# Patient Record
Sex: Female | Born: 1980 | Race: Black or African American | Hispanic: No | Marital: Married | Smoking: Never smoker
Health system: Southern US, Community
[De-identification: ages and names within clinical notes are randomized; demographics above are authoritative.]

## PROBLEM LIST (undated history)

## (undated) DIAGNOSIS — G510 Bell's palsy: Secondary | ICD-10-CM

## (undated) DIAGNOSIS — E282 Polycystic ovarian syndrome: Secondary | ICD-10-CM

## (undated) DIAGNOSIS — E119 Type 2 diabetes mellitus without complications: Secondary | ICD-10-CM

## (undated) HISTORY — PX: WISDOM TOOTH EXTRACTION: SHX21

## (undated) HISTORY — DX: Type 2 diabetes mellitus without complications: E11.9

---

## 2012-04-01 DIAGNOSIS — G510 Bell's palsy: Secondary | ICD-10-CM | POA: Insufficient documentation

## 2015-03-19 DIAGNOSIS — E119 Type 2 diabetes mellitus without complications: Secondary | ICD-10-CM | POA: Insufficient documentation

## 2015-06-06 ENCOUNTER — Encounter: Payer: Self-pay | Admitting: General Surgery

## 2015-06-06 ENCOUNTER — Ambulatory Visit (INDEPENDENT_AMBULATORY_CARE_PROVIDER_SITE_OTHER): Payer: BLUE CROSS/BLUE SHIELD | Admitting: General Surgery

## 2015-06-06 VITALS — BP 134/82 | HR 70 | Resp 12 | Ht 67.0 in | Wt 247.0 lb

## 2015-06-06 DIAGNOSIS — L02411 Cutaneous abscess of right axilla: Secondary | ICD-10-CM | POA: Diagnosis not present
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
## 2015-06-06 NOTE — Patient Instructions (Signed)
The patient is aware to call back for any questions or concerns.  

## 2015-06-07 ENCOUNTER — Encounter: Payer: Self-pay | Admitting: General Surgery

## 2015-06-10 ENCOUNTER — Telehealth: Payer: Self-pay | Admitting: *Deleted

## 2015-06-10 NOTE — Telephone Encounter (Signed)
Patient calling with questions regarding dressing changes. She is unsure if she needs to be changing outer or inner dressings.   This patient had an incision and drainage of a right axillary abscess last Thursday, 06-06-15.  Patient is scheduled for follow up with Dr. Evette Cristal on 07-11-15.  Please call patient on cell number, [phone].

## 2015-06-10 NOTE — Telephone Encounter (Signed)
Spoke with patient and advised to change all layers of her dressing. She may shower. Pat area dry and use gauze if needed and a bandage to cover until drainage stops. She is to call with any further questions. Follow up as scheduled.

## 2015-06-13 LAB — ANAEROBIC AND AEROBIC CULTURE

## 2015-07-11 ENCOUNTER — Ambulatory Visit: Payer: BLUE CROSS/BLUE SHIELD | Admitting: General Surgery

## 2015-07-24 ENCOUNTER — Telehealth: Payer: Self-pay | Admitting: *Deleted

## 2015-07-24 NOTE — Telephone Encounter (Signed)
Just calling to check and she if she is better or having any issues since her last visit.

## 2015-09-11 ENCOUNTER — Encounter: Payer: Self-pay | Admitting: *Deleted

## 2016-04-02 DIAGNOSIS — E119 Type 2 diabetes mellitus without complications: Secondary | ICD-10-CM | POA: Insufficient documentation

## 2016-04-02 DIAGNOSIS — N920 Excessive and frequent menstruation with regular cycle: Secondary | ICD-10-CM | POA: Insufficient documentation

## 2016-04-02 DIAGNOSIS — E559 Vitamin D deficiency, unspecified: Secondary | ICD-10-CM | POA: Insufficient documentation

## 2016-04-03 DIAGNOSIS — N939 Abnormal uterine and vaginal bleeding, unspecified: Secondary | ICD-10-CM | POA: Insufficient documentation

## 2016-11-02 ENCOUNTER — Emergency Department
Admission: EM | Admit: 2016-11-02 | Discharge: 2016-11-02 | Disposition: A | Discharge status: Home / Self Care | Payer: BLUE CROSS/BLUE SHIELD | Attending: Emergency Medicine | Admitting: Emergency Medicine

## 2016-11-02 ENCOUNTER — Encounter: Payer: Self-pay | Admitting: Emergency Medicine

## 2016-11-02 DIAGNOSIS — Z7984 Long term (current) use of oral hypoglycemic drugs: Secondary | ICD-10-CM | POA: Insufficient documentation

## 2016-11-02 DIAGNOSIS — E119 Type 2 diabetes mellitus without complications: Secondary | ICD-10-CM | POA: Insufficient documentation

## 2016-11-02 DIAGNOSIS — H9201 Otalgia, right ear: Secondary | ICD-10-CM | POA: Diagnosis not present

## 2016-11-02 MED ORDER — IBUPROFEN 600 MG PO TABS
600.0000 mg | ORAL_TABLET | Freq: Once | ORAL | Status: AC
  Administered 2016-11-02: 600 mg via ORAL
  Filled 2016-11-02: qty 1

## 2016-11-02 MED ORDER — TRAMADOL HCL 50 MG PO TABS: 50.0000 mg | ORAL_TABLET | Freq: Four times a day (QID) | ORAL | 0 refills | Status: AC | PRN

## 2016-11-02 MED ORDER — AMOXICILLIN 500 MG PO CAPS: 500.0000 mg | ORAL_CAPSULE | Freq: Three times a day (TID) | ORAL | 0 refills | Status: DC

## 2016-11-02 MED ORDER — PSEUDOEPHEDRINE HCL ER 120 MG PO TB12
120.0000 mg | ORAL_TABLET | Freq: Two times a day (BID) | ORAL | Status: DC
  Administered 2016-11-02: 120 mg via ORAL
  Filled 2016-11-02: qty 1

## 2016-11-02 MED ORDER — IBUPROFEN 600 MG PO TABS: 600.0000 mg | ORAL_TABLET | Freq: Three times a day (TID) | ORAL | 0 refills | Status: DC | PRN

## 2016-11-02 MED ORDER — PSEUDOEPHEDRINE HCL ER 120 MG PO TB12: 120.0000 mg | ORAL_TABLET | Freq: Two times a day (BID) | ORAL | 0 refills | Status: AC | PRN

## 2016-11-02 MED ORDER — TRAMADOL HCL 50 MG PO TABS
50.0000 mg | ORAL_TABLET | Freq: Once | ORAL | Status: AC
  Administered 2016-11-02: 50 mg via ORAL
  Filled 2016-11-02: qty 1

## 2016-11-02 NOTE — ED Notes (Signed)
See triage note   States she developed pain to right side of face on Friday  Pain is mainly in right ear and glands are swollen had some fever yesterday  Afebrile on arrival

## 2016-11-02 NOTE — ED Provider Notes (Signed)
[facility] Emergency Department Provider Note   ____________________________________________   First MD Initiated Contact with Patient 11/02/16 0813     (approximate)  I have reviewed the triage vital signs and the nursing notes.   HISTORY  Chief Complaint Otalgia  Patient complaining  HPI Sara Henry is a 36 y.o. female patient complaining of right ear pain which started yesterday. Patient denies any URI signs and symptoms. Patient also states she knows the gland behind her right ear is swollen. Patient had mild fever yesterday. Patient afebrile in triage. Patient rates the pain as 8/10.Patient describes pain as "achy". No palliative measures taken for this complaint. Patient denies hearing loss.   Past Medical History:  Diagnosis Date  . Diabetes mellitus without complication (HCC)     There are no active problems to display for this patient.   Past Surgical History:  Procedure Laterality Date  . WISDOM TOOTH EXTRACTION      Prior to Admission medications   Medication Sig Start Date End Date Taking? Authorizing Provider  metFORMIN (GLUCOPHAGE) 500 MG tablet Take 500 mg by mouth 2 (two) times daily with a meal.   Yes Historical Provider, MD  amoxicillin (AMOXIL) 500 MG capsule Take 1 capsule (500 mg total) by mouth 3 (three) times daily. 11/02/16   Joni Reining, PA-C  ibuprofen (ADVIL,MOTRIN) 600 MG tablet Take 1 tablet (600 mg total) by mouth every 8 (eight) hours as needed. 11/02/16   Joni Reining, PA-C  pseudoephedrine (SUDAFED) 120 MG 12 hr tablet Take 1 tablet (120 mg total) by mouth 2 (two) times daily as needed for congestion. 11/02/16 11/02/17  Joni Reining, PA-C  sulfamethoxazole-trimethoprim (BACTRIM DS,SEPTRA DS) 800-160 MG per tablet  06/01/15   Historical Provider, MD  traMADol (ULTRAM) 50 MG tablet Take 1 tablet (50 mg total) by mouth every 6 (six) hours as needed. 11/02/16 11/02/17  Joni Reining, PA-C    Allergies Patient  has no known allergies.  Family History  Problem Relation Age of Onset  . Diabetes Father     Social History Social History  Substance Use Topics  . Smoking status: Never Smoker  . Smokeless tobacco: Never Used  . Alcohol use No    Review of Systems Constitutional: No fever/chills Eyes: No visual changes. ENT: No sore throat. Right ear pain Cardiovascular: Denies chest pain. Respiratory: Denies shortness of breath. Gastrointestinal: No abdominal pain.  No nausea, no vomiting.  No diarrhea.  No constipation. Genitourinary: Negative for dysuria. Musculoskeletal: Negative for back pain. Skin: Negative for rash. Neurological: Negative for headaches, focal weakness or numbness.    ____________________________________________   PHYSICAL EXAM:  VITAL SIGNS: ED Triage Vitals  Enc Vitals Group     BP 11/02/16 0802 116/77     Pulse Rate 11/02/16 0802 (!) 58     Resp 11/02/16 0802 18     Temp 11/02/16 0802 98.2 F (36.8 C)     Temp Source 11/02/16 0802 Oral     SpO2 11/02/16 0802 97 %     Weight 11/02/16 0803 247 lb (112 kg)     Height --      Head Circumference --      Peak Flow --      Pain Score 11/02/16 0803 5     Pain Loc --      Pain Edu? --      Excl. in GC? --     Constitutional: Alert and oriented. Well appearing and in no  acute distress. Eyes: Conjunctivae are normal. PERRL. EOMI. Head: Atraumatic. Nose: No congestion/rhinnorhea. Mouth/Throat: Mucous membranes are moist.  Oropharynx non-erythematous. EARS: Edematous and erythematous right TM. Neck: No stridor.  No cervical spine tenderness to palpation. Hematological/Lymphatic/Immunilogical: Right post auricular lymphadenopathy. Cardiovascular: Normal rate, regular rhythm. Grossly normal heart sounds.  Good peripheral circulation. Respiratory: Normal respiratory effort.  No retractions. Lungs CTAB. Gastrointestinal: Soft and nontender. No distention. No abdominal bruits. No CVA  tenderness. Musculoskeletal: No lower extremity tenderness nor edema.  No joint effusions. Neurologic:  Normal speech and language. No gross focal neurologic deficits are appreciated. No gait instability. Skin:  Skin is warm, dry and intact. No rash noted. Psychiatric: Mood and affect are normal. Speech and behavior are normal.  ____________________________________________   LABS (all labs ordered are listed, but only abnormal results are displayed)  Labs Reviewed - No data to display ____________________________________________  EKG   ____________________________________________  RADIOLOGY   ____________________________________________   PROCEDURES  Procedure(s) performed: None  Procedures  Critical Care performed: No  ____________________________________________   INITIAL IMPRESSION / ASSESSMENT AND PLAN / ED COURSE  Pertinent labs & imaging results that were available during my care of the patient were reviewed by me and considered in my medical decision making (see chart for details).  Right otitis media. Patient given discharge care instructions. Patient given a prescription for amoxicillin, tramadol, Sudafed, and ibuprofen. Patient advised follow-up family doctor no improvement 3 days.      ____________________________________________   FINAL CLINICAL IMPRESSION(S) / ED DIAGNOSES  Final diagnoses:  Otalgia of right ear      NEW MEDICATIONS STARTED DURING THIS VISIT:  New Prescriptions   AMOXICILLIN (AMOXIL) 500 MG CAPSULE    Take 1 capsule (500 mg total) by mouth 3 (three) times daily.   IBUPROFEN (ADVIL,MOTRIN) 600 MG TABLET    Take 1 tablet (600 mg total) by mouth every 8 (eight) hours as needed.   PSEUDOEPHEDRINE (SUDAFED) 120 MG 12 HR TABLET    Take 1 tablet (120 mg total) by mouth 2 (two) times daily as needed for congestion.   TRAMADOL (ULTRAM) 50 MG TABLET    Take 1 tablet (50 mg total) by mouth every 6 (six) hours as needed.     Note:   This document was prepared using Dragon voice recognition software and may include unintentional dictation errors.    Joni Reiningonald K Rosalba Totty, PA-C 11/02/16 [id number]    Rockne MenghiniAnne-Caroline Norman, MD 11/02/16 [phone]

## 2016-11-02 NOTE — ED Triage Notes (Signed)
Pt presents with right ear pain started yesterday.

## 2016-11-06 ENCOUNTER — Telehealth: Payer: Self-pay | Admitting: Emergency Medicine

## 2016-11-06 NOTE — Telephone Encounter (Signed)
Called in response to voicemail patient left me. Mailbox full, so I could not leave message.

## 2017-04-14 ENCOUNTER — Encounter: Payer: Self-pay | Admitting: Emergency Medicine

## 2017-04-14 ENCOUNTER — Emergency Department
Admission: EM | Admit: 2017-04-14 | Discharge: 2017-04-14 | Disposition: A | Discharge status: Home / Self Care | Payer: BLUE CROSS/BLUE SHIELD | Attending: Emergency Medicine | Admitting: Emergency Medicine

## 2017-04-14 ENCOUNTER — Emergency Department: Payer: BLUE CROSS/BLUE SHIELD

## 2017-04-14 DIAGNOSIS — R1030 Lower abdominal pain, unspecified: Secondary | ICD-10-CM | POA: Diagnosis not present

## 2017-04-14 DIAGNOSIS — S0083XA Contusion of other part of head, initial encounter: Secondary | ICD-10-CM

## 2017-04-14 DIAGNOSIS — Z7984 Long term (current) use of oral hypoglycemic drugs: Secondary | ICD-10-CM | POA: Insufficient documentation

## 2017-04-14 DIAGNOSIS — Y999 Unspecified external cause status: Secondary | ICD-10-CM | POA: Insufficient documentation

## 2017-04-14 DIAGNOSIS — T7491XA Unspecified adult maltreatment, confirmed, initial encounter: Secondary | ICD-10-CM

## 2017-04-14 DIAGNOSIS — H538 Other visual disturbances: Secondary | ICD-10-CM | POA: Diagnosis not present

## 2017-04-14 DIAGNOSIS — X58XXXA Exposure to other specified factors, initial encounter: Secondary | ICD-10-CM | POA: Diagnosis not present

## 2017-04-14 DIAGNOSIS — Y92009 Unspecified place in unspecified non-institutional (private) residence as the place of occurrence of the external cause: Secondary | ICD-10-CM | POA: Diagnosis not present

## 2017-04-14 DIAGNOSIS — S0003XA Contusion of scalp, initial encounter: Secondary | ICD-10-CM | POA: Insufficient documentation

## 2017-04-14 DIAGNOSIS — G501 Atypical facial pain: Secondary | ICD-10-CM | POA: Diagnosis present

## 2017-04-14 DIAGNOSIS — Y939 Activity, unspecified: Secondary | ICD-10-CM | POA: Insufficient documentation

## 2017-04-14 DIAGNOSIS — E119 Type 2 diabetes mellitus without complications: Secondary | ICD-10-CM | POA: Diagnosis not present

## 2017-04-14 DIAGNOSIS — H1132 Conjunctival hemorrhage, left eye: Secondary | ICD-10-CM | POA: Insufficient documentation

## 2017-04-14 LAB — BASIC METABOLIC PANEL
ANION GAP: 7 (ref 5–15)
BUN: 8 mg/dL (ref 6–20)
CALCIUM: 9.2 mg/dL (ref 8.9–10.3)
CO2: 24 mmol/L (ref 22–32)
Chloride: 107 mmol/L (ref 101–111)
Creatinine, Ser: 0.7 mg/dL (ref 0.44–1.00)
GFR calc Af Amer: >60 mL/min (ref >60)
GLUCOSE: 100 mg/dL — AB (ref 65–99)
POTASSIUM: 3.7 mmol/L (ref 3.5–5.1)
SODIUM: 138 mmol/L (ref 135–145)

## 2017-04-14 LAB — CBC WITH DIFFERENTIAL/PLATELET
Basophils Absolute: 0 10*3/uL (ref 0–0.1)
Basophils Relative: 0 %
Eosinophils Absolute: 0 10*3/uL (ref 0–0.7)
Eosinophils Relative: 0 %
HCT: 33.9 % — ABNORMAL LOW (ref 35.0–47.0)
Hemoglobin: 11 g/dL — ABNORMAL LOW (ref 12.0–16.0)
Lymphocytes Relative: 24 %
Lymphs Abs: 2.4 10*3/uL (ref 1.0–3.6)
MCH: 25.7 pg — ABNORMAL LOW (ref 26.0–34.0)
MCHC: 32.3 g/dL (ref 32.0–36.0)
MCV: 79.4 fL — ABNORMAL LOW (ref 80.0–100.0)
Monocytes Absolute: 0.7 10*3/uL (ref 0.2–0.9)
Monocytes Relative: 7 %
Neutro Abs: 6.9 10*3/uL — ABNORMAL HIGH (ref 1.4–6.5)
Neutrophils Relative %: 69 %
Platelets: 359 10*3/uL (ref 150–440)
RBC: 4.27 MIL/uL (ref 3.80–5.20)
RDW: 16.8 % — ABNORMAL HIGH (ref 11.5–14.5)
WBC: 10.1 10*3/uL (ref 3.6–11.0)

## 2017-04-14 LAB — HEPATIC FUNCTION PANEL
ALT: 28 U/L (ref 14–54)
AST: 22 U/L (ref 15–41)
Albumin: 4 g/dL (ref 3.5–5.0)
Alkaline Phosphatase: 60 U/L (ref 38–126)
Bilirubin, Direct: <0.1 mg/dL — ABNORMAL LOW (ref 0.1–0.5)
Total Bilirubin: 0.7 mg/dL (ref 0.3–1.2)
Total Protein: 7.5 g/dL (ref 6.5–8.1)

## 2017-04-14 LAB — LIPASE, BLOOD: Lipase: 33 U/L (ref 11–51)

## 2017-04-14 LAB — HCG, QUANTITATIVE, PREGNANCY

## 2017-04-14 MED ORDER — SODIUM CHLORIDE 0.9 % IV BOLUS (SEPSIS)
1000.0000 mL | Freq: Once | INTRAVENOUS | Status: AC
  Administered 2017-04-14: 1000 mL via INTRAVENOUS

## 2017-04-14 MED ORDER — IBUPROFEN 600 MG PO TABS
600.0000 mg | ORAL_TABLET | Freq: Once | ORAL | Status: AC
  Administered 2017-04-14: 600 mg via ORAL
  Filled 2017-04-14: qty 1

## 2017-04-14 MED ORDER — OXYCODONE-ACETAMINOPHEN 5-325 MG PO TABS
1.0000 | ORAL_TABLET | Freq: Once | ORAL | Status: DC
  Filled 2017-04-14: qty 1

## 2017-04-14 MED ORDER — IOPAMIDOL (ISOVUE-300) INJECTION 61%
100.0000 mL | Freq: Once | INTRAVENOUS | Status: AC | PRN
Start: 2017-04-14 — End: 2017-04-14
  Administered 2017-04-14: 100 mL via INTRAVENOUS
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
## 2017-04-14 NOTE — ED Notes (Signed)
Dr. Lamont Snowballifenbark in room to discuss plan of care.  Will continue to monitor.
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
## 2017-06-26 ENCOUNTER — Encounter: Payer: Self-pay | Admitting: Emergency Medicine

## 2017-06-26 ENCOUNTER — Emergency Department
Admission: EM | Admit: 2017-06-26 | Discharge: 2017-06-26 | Disposition: A | Discharge status: Home / Self Care | Payer: BLUE CROSS/BLUE SHIELD | Attending: Emergency Medicine | Admitting: Emergency Medicine

## 2017-06-26 DIAGNOSIS — G510 Bell's palsy: Secondary | ICD-10-CM | POA: Diagnosis not present

## 2017-06-26 DIAGNOSIS — Z7984 Long term (current) use of oral hypoglycemic drugs: Secondary | ICD-10-CM | POA: Insufficient documentation

## 2017-06-26 DIAGNOSIS — R29810 Facial weakness: Secondary | ICD-10-CM | POA: Diagnosis present

## 2017-06-26 DIAGNOSIS — E119 Type 2 diabetes mellitus without complications: Secondary | ICD-10-CM | POA: Insufficient documentation

## 2017-06-26 HISTORY — DX: Bell's palsy: G51.0

## 2017-06-26 LAB — GLUCOSE, CAPILLARY: GLUCOSE-CAPILLARY: 94 mg/dL (ref 65–99)

## 2017-06-26 MED ORDER — PREDNISONE 20 MG PO TABS: 60.0000 mg | ORAL_TABLET | Freq: Every day | ORAL | 0 refills | Status: DC

## 2017-06-26 MED ORDER — PREDNISONE 20 MG PO TABS
60.0000 mg | ORAL_TABLET | Freq: Once | ORAL | Status: AC
  Administered 2017-06-26: 60 mg via ORAL
  Filled 2017-06-26: qty 3

## 2017-06-26 MED ORDER — VALACYCLOVIR HCL 1 G PO TABS: 1000.0000 mg | ORAL_TABLET | Freq: Three times a day (TID) | ORAL | 0 refills | Status: AC

## 2017-06-26 MED ORDER — VALACYCLOVIR HCL 500 MG PO TABS
1000.0000 mg | ORAL_TABLET | Freq: Once | ORAL | Status: AC
  Administered 2017-06-26: 1000 mg via ORAL
  Filled 2017-06-26: qty 2

## 2017-06-26 NOTE — ED Triage Notes (Signed)
Pt to ED for right sided facial palsy x 8 days. Pt states that she started having discomfort in her face and facial swelling last Friday after being exposed to grass. Pt states that On Saturday she woke up with eye swelling, lips were swelling, on Sunday she developed tongue numbness. Pt was seen by her PCP who thought she was having an allergic reaction to grass and told her to take allergy medication. Pt states that her symptoms have not improved. Pt now has palsy on the right side of her face. Pt states that she has hx/o Bells Palsy but it has affected the left side of her face in the past.   Pt in NAD at this time.

## 2017-06-26 NOTE — ED Provider Notes (Addendum)
[facility] Emergency Department Provider Note  ____________________________________________   First MD Initiated Contact with Patient 06/26/17 [phone]     (approximate)  I have reviewed the triage vital signs and the nursing notes.   HISTORY  Chief Complaint Facial Droop   HPI Sara Henry is a 37 y.o. female with a history of diabetes and Bell's palsy who is presenting to the emergency department today with a right-sided facial droop. She says that her symptoms started a week and a half ago when freshly cut grass blue into her car window. She says at that time she had swelling of her left eye and swelling of the right side of her face was gout medicine by her primary care doctor. However, starting this past Wednesday and Thursday she began noticing right-sided facial drooping. She also noticed some dryness to her right eye. She has had 2 previous episodes of Bell's palsy, but both on the left. She says that Bell's palsy also runs in her family and that her grandmothers had issues with it in the past. She denies any stroke history. Denies any weakness or numbness besides the facial droop. Denies any pain.   Past Medical History:  Diagnosis Date  . Bell's palsy   . Diabetes mellitus without complication (HCC)     There are no active problems to display for this patient.   Past Surgical History:  Procedure Laterality Date  . WISDOM TOOTH EXTRACTION      Prior to Admission medications   Medication Sig Start Date End Date Taking? Authorizing Provider  ibuprofen (ADVIL,MOTRIN) 600 MG tablet Take 1 tablet (600 mg total) by mouth every 8 (eight) hours as needed. Patient not taking: Reported on 04/14/2017 11/02/16   Joni Reining, PA-C  metFORMIN (GLUCOPHAGE) 1000 MG tablet Take 1,000 mg by mouth 2 (two) times daily. 04/07/17   [provider]  pseudoephedrine (SUDAFED) 120 MG 12 hr tablet Take 1 tablet (120 mg total) by mouth 2 (two) times daily as  needed for congestion. Patient not taking: Reported on 04/14/2017 11/02/16 11/02/17  Joni Reining, PA-C  traMADol (ULTRAM) 50 MG tablet Take 1 tablet (50 mg total) by mouth every 6 (six) hours as needed. Patient not taking: Reported on 04/14/2017 11/02/16 11/02/17  Joni Reining, PA-C    Allergies Patient has no known allergies.  Family History  Problem Relation Age of Onset  . Diabetes Father     Social History Social History  Substance Use Topics  . Smoking status: Never Smoker  . Smokeless tobacco: Never Used  . Alcohol use No    Review of Systems  Constitutional: No fever/chills Eyes: No visual changes. ENT: No sore throat. Cardiovascular: Denies chest pain. Respiratory: Denies shortness of breath. Gastrointestinal: No abdominal pain.  No nausea, no vomiting.  No diarrhea.  No constipation. Genitourinary: Negative for dysuria. Musculoskeletal: Negative for back pain. Skin: Negative for rash. Neurological: Negative for headaches or numbness.   ____________________________________________   PHYSICAL EXAM:  VITAL SIGNS: ED Triage Vitals  Enc Vitals Group     BP 06/26/17 0810 140/81     Pulse Rate 06/26/17 0810 65     Resp 06/26/17 0810 16     Temp 06/26/17 0810 98.2 F (36.8 C)     Temp Source 06/26/17 0810 Oral     SpO2 06/26/17 0810 98 %     Weight 06/26/17 0811 255 lb (115.7 kg)     Height --      Head  Circumference --      Peak Flow --      Pain Score 06/26/17 0810 6     Pain Loc --      Pain Edu? --      Excl. in GC? --     Constitutional: Alert and oriented. Well appearing and in no acute distress. Eyes: Conjunctivae are normal. EOMI. Head: Atraumatic. Nose: No congestion/rhinnorhea.TMs are normal bilaterally. Mouth/Throat: Mucous membranes are moist. no cold sores/ulcerations  present Neck: No stridor.   Cardiovascular: Normal rate, regular rhythm. Grossly normal heart sounds.   Respiratory: Normal respiratory effort.  No retractions. Lungs  CTAB. Gastrointestinal: Soft and nontender. No distention. No CVA tenderness. Musculoskeletal: No lower extremity tenderness nor edema.  No joint effusions. Neurologic:  Normal speech and language.  Right-sided peripheral cranial nerve VII paralysis. Right eyelid lags behind the left when closing her eyes and there are no fullness present on 4 Edwin*) eyes tightly. There is equal sensation to the bilateral sides of the face. There is no tongue deviation.5 out of 5 strength throughout with no sensory deficits.  Skin:  Skin is warm, dry and intact. No rash noted. Psychiatric: Mood and affect are normal. Speech and behavior are normal.  ____________________________________________   LABS (all labs ordered are listed, but only abnormal results are displayed)  Labs Reviewed  B. BURGDOR[facility] ANTIBODIES  CBG MONITORING, ED   ____________________________________________  EKG   ____________________________________________  RADIOLOGY   ____________________________________________   PROCEDURES  Procedure(s) performed:   Procedures  Critical Care performed:   ____________________________________________   INITIAL IMPRESSION / ASSESSMENT AND PLAN / ED COURSE  Pertinent labs & imaging results that were available during my care of the patient were reviewed by me and considered in my medical decision making (see chart for details).  Patient also denies any known tick bites. We will send Lyme titers. She says that treating with steroids as well as antiviral medication had worked for her in the past. Clear peripheral nerve VII deficit to the right side of the patient's face. Unclear relation if any to her possible allergic reaction one half weeks ago. Patient says that the symptoms started late Wednesday and Thursday of this past week which puts her within a 72 are 10. She'll be given prednisone as well as valacyclovir. She'll be following up with her primary care doctor. We discussed  stroke symptoms as well as a possible CAT scan with the patient as well as myself believe that this is very consistent with Bell's palsy and that we may very go further workup or imaging at this time. She is understanding the plan and willing to comply.      ____________________________________________   FINAL CLINICAL IMPRESSION(S) / ED DIAGNOSES  Bell's palsy.    NEW MEDICATIONS STARTED DURING THIS VISIT:  New Prescriptions   No medications on file     Note:  This document was prepared using Dragon voice recognition software and may include unintentional dictation errors.     Albertus Chiarelli, Myra Rude, MD 06/26/17 [phone]  Patient states that she is a primary care doctor in [place], Myra Rude, MD 06/26/17 [phone]

## 2017-06-28 LAB — B. BURGDORFI ANTIBODIES

## 2017-07-09 ENCOUNTER — Emergency Department
Admission: EM | Admit: 2017-07-09 | Discharge: 2017-07-09 | Disposition: A | Discharge status: Home / Self Care | Payer: BLUE CROSS/BLUE SHIELD | Attending: Emergency Medicine | Admitting: Emergency Medicine

## 2017-07-09 DIAGNOSIS — Z79899 Other long term (current) drug therapy: Secondary | ICD-10-CM | POA: Insufficient documentation

## 2017-07-09 DIAGNOSIS — H9201 Otalgia, right ear: Secondary | ICD-10-CM | POA: Diagnosis not present

## 2017-07-09 DIAGNOSIS — H6981 Other specified disorders of Eustachian tube, right ear: Secondary | ICD-10-CM

## 2017-07-09 DIAGNOSIS — E119 Type 2 diabetes mellitus without complications: Secondary | ICD-10-CM | POA: Insufficient documentation

## 2017-07-09 DIAGNOSIS — Z7984 Long term (current) use of oral hypoglycemic drugs: Secondary | ICD-10-CM | POA: Insufficient documentation

## 2017-07-09 MED ORDER — CETIRIZINE HCL 10 MG PO TABS: 10.0000 mg | ORAL_TABLET | Freq: Every day | ORAL | 0 refills | Status: DC | PRN

## 2017-07-09 MED ORDER — LIDOCAINE HCL (PF) 1 % IJ SOLN
2.0000 mL | Freq: Once | INTRAMUSCULAR | Status: AC
  Administered 2017-07-09: 2 mL
  Filled 2017-07-09: qty 5

## 2017-07-09 MED ORDER — LORATADINE 10 MG PO TABS
10.0000 mg | ORAL_TABLET | Freq: Once | ORAL | Status: AC
  Administered 2017-07-09: 10 mg via ORAL
  Filled 2017-07-09: qty 1

## 2017-07-09 NOTE — ED Provider Notes (Signed)
[facility] Emergency Department Provider Note   ____________________________________________   First MD Initiated Contact with Patient 07/09/17 0129     (approximate)  I have reviewed the triage vital signs and the nursing notes.   HISTORY  Chief Complaint Otalgia    HPI Sara Henry is a 36 y.o. female who presents to the ED from home with a chief complaint of right ear pain. Patient reports she started with the scratchy throat this morning and is now having right ear pain and some itching to her left ear. Recently finished a ten-day course of Valtrex and prednisone for Bell's palsy and rates improvement in her right facial paralysis symptoms. States her right ear feels full and like there is fluid behind it. Denies associated fever, chills, cough, congestion, chest pain, shortness of breath, abdominal pain, nausea, vomiting. Denies recent travel or trauma. Denies swimming or air travel.   Past Medical History:  Diagnosis Date  . Bell's palsy   . Diabetes mellitus without complication (HCC)     There are no active problems to display for this patient.   Past Surgical History:  Procedure Laterality Date  . WISDOM TOOTH EXTRACTION      Prior to Admission medications   Medication Sig Start Date End Date Taking? Authorizing Provider  cetirizine (ZYRTEC) 10 MG tablet Take 1 tablet (10 mg total) by mouth daily as needed for allergies. 07/09/17   Irean Hong, MD  ibuprofen (ADVIL,MOTRIN) 600 MG tablet Take 1 tablet (600 mg total) by mouth every 8 (eight) hours as needed. Patient not taking: Reported on 04/14/2017 11/02/16   Joni Reining, PA-C  metFORMIN (GLUCOPHAGE) 1000 MG tablet Take 1,000 mg by mouth 2 (two) times daily. 04/07/17   [provider]  predniSONE (DELTASONE) 20 MG tablet Take 3 tablets (60 mg total) by mouth daily. 06/26/17 06/26/18  Myrna Blazer, MD  pseudoephedrine (SUDAFED) 120 MG 12 hr tablet Take 1 tablet (120  mg total) by mouth 2 (two) times daily as needed for congestion. Patient not taking: Reported on 04/14/2017 11/02/16 11/02/17  Joni Reining, PA-C  traMADol (ULTRAM) 50 MG tablet Take 1 tablet (50 mg total) by mouth every 6 (six) hours as needed. Patient not taking: Reported on 04/14/2017 11/02/16 11/02/17  Joni Reining, PA-C    Allergies Patient has no known allergies.  Family History  Problem Relation Age of Onset  . Diabetes Father     Social History Social History  Substance Use Topics  . Smoking status: Never Smoker  . Smokeless tobacco: Never Used  . Alcohol use No    Review of Systems  Constitutional: No fever/chills. Eyes: No visual changes. ENT: positive for right ear pain/fullness, left your itching and scratchy throat. Cardiovascular: Denies chest pain. Respiratory: Denies shortness of breath. Gastrointestinal: No abdominal pain.  No nausea, no vomiting.  No diarrhea.  No constipation. Genitourinary: Negative for dysuria. Musculoskeletal: Negative for back pain. Skin: Negative for rash. Neurological: Negative for headaches, focal weakness or numbness.   ____________________________________________   PHYSICAL EXAM:  VITAL SIGNS: ED Triage Vitals  Enc Vitals Group     BP 07/09/17 0026 127/72     Pulse Rate 07/09/17 0026 95     Resp 07/09/17 0026 18     Temp 07/09/17 0026 98.7 F (37.1 C)     Temp Source 07/09/17 0026 Oral     SpO2 07/09/17 0026 96 %     Weight 07/09/17 0026 255 lb (115.7 kg)  Height 07/09/17 0026  (1.702 m)     Head Circumference --      Peak Flow --      Pain Score 07/09/17 0025 8     Pain Loc --      Pain Edu? --      Excl. in GC? --     Constitutional: Alert and oriented. Well appearing and in no acute distress. Eyes: Conjunctivae are normal. PERRL. EOMI. Head: Atraumatic. Ears: Left ear within normal limits other than some cerumen. Right ear with mild fluid behind TM. TM without erythema or perforation. Nose: No  congestion/rhinnorhea. Mouth/Throat: Mucous membranes are moist.  Oropharynx non-erythematous. There is no hoarse muffled voice. There is no drooling. Neck: No stridor.   Cardiovascular: Normal rate, regular rhythm. Grossly normal heart sounds.  Good peripheral circulation. Respiratory: Normal respiratory effort.  No retractions. Lungs CTAB. Gastrointestinal: Soft and nontender. No distention. No abdominal bruits. No CVA tenderness. Musculoskeletal: No lower extremity tenderness nor edema.  No joint effusions. Neurologic:  Normal speech and language. Very mild right facial paralysis due to peripheral CN VII. No gross focal neurologic deficits are appreciated. No gait instability. Skin:  Skin is warm, dry and intact. No rash noted. No petechiae. Psychiatric: Mood and affect are normal. Speech and behavior are normal.  ____________________________________________   LABS (all labs ordered are listed, but only abnormal results are displayed)  Labs Reviewed - No data to display ____________________________________________  EKG  none ____________________________________________  RADIOLOGY  No results found.  ____________________________________________   PROCEDURES  Procedure(s) performed: None  Procedures  Critical Care performed: No  ____________________________________________   INITIAL IMPRESSION / ASSESSMENT AND PLAN / ED COURSE  Pertinent labs & imaging results that were available during my care of the patient were reviewed by me and considered in my medical decision making (see chart for details).  36 year old diabetic who presents with right eustachian tube dysfunction. She just finished a course of prednisone and Valtrex for Bell's palsy with improvement of her facial paralysis. Given that she is a diabetic who recently finished prednisone, we discussed my reluctance to place her on another course of steroid for her eustachian tube dysfunction which is mild.  Lidocaine drops applied for discomfort. Recommend taking a daily antihistamine as needed for symptomatic relief, and OTC eardrops for pain. Strict return precautions given. Patient verbalizes understanding and agrees with plan of care.      ____________________________________________   FINAL CLINICAL IMPRESSION(S) / ED DIAGNOSES  Final diagnoses:  Otalgia of right ear  Eustachian tube dysfunction, right      NEW MEDICATIONS STARTED DURING THIS VISIT:  New Prescriptions   CETIRIZINE (ZYRTEC) 10 MG TABLET    Take 1 tablet (10 mg total) by mouth daily as needed for allergies.     Note:  This document was prepared using Dragon voice recognition software and may include unintentional dictation errors.    Irean Hong, MD 07/09/17 [phone]

## 2017-07-09 NOTE — ED Notes (Signed)

## 2017-07-09 NOTE — Discharge Instructions (Signed)
1. Take Zyrtec daily as needed. This is also available over-the-counter. 2. Return to the ER for worsening symptoms, persistent vomiting, fever, difficulty breathing or other concerns.

## 2017-07-09 NOTE — ED Triage Notes (Signed)
Pt states that she started with a sore throat this am and now is having rt ear pain with some itching to the left ear, pt states that throughout the day the ear started to feel like it was closing up, pt reports being awakened from sleep with the pain tonight, denies taking meds pta

## 2017-09-24 ENCOUNTER — Other Ambulatory Visit: Payer: Self-pay

## 2017-09-24 ENCOUNTER — Encounter: Payer: Self-pay | Admitting: Emergency Medicine

## 2017-09-24 ENCOUNTER — Emergency Department: Payer: BLUE CROSS/BLUE SHIELD

## 2017-09-24 ENCOUNTER — Emergency Department
Admission: EM | Admit: 2017-09-24 | Discharge: 2017-09-24 | Disposition: A | Discharge status: Home / Self Care | Payer: BLUE CROSS/BLUE SHIELD | Attending: Emergency Medicine | Admitting: Emergency Medicine

## 2017-09-24 DIAGNOSIS — Y939 Activity, unspecified: Secondary | ICD-10-CM | POA: Diagnosis not present

## 2017-09-24 DIAGNOSIS — Y33XXXA Other specified events, undetermined intent, initial encounter: Secondary | ICD-10-CM | POA: Insufficient documentation

## 2017-09-24 DIAGNOSIS — S93601A Unspecified sprain of right foot, initial encounter: Secondary | ICD-10-CM | POA: Insufficient documentation

## 2017-09-24 DIAGNOSIS — E119 Type 2 diabetes mellitus without complications: Secondary | ICD-10-CM | POA: Diagnosis not present

## 2017-09-24 DIAGNOSIS — Z7984 Long term (current) use of oral hypoglycemic drugs: Secondary | ICD-10-CM | POA: Insufficient documentation

## 2017-09-24 DIAGNOSIS — Y998 Other external cause status: Secondary | ICD-10-CM | POA: Diagnosis not present

## 2017-09-24 DIAGNOSIS — Y929 Unspecified place or not applicable: Secondary | ICD-10-CM | POA: Diagnosis not present

## 2017-09-24 DIAGNOSIS — M79674 Pain in right toe(s): Secondary | ICD-10-CM

## 2017-09-24 DIAGNOSIS — S99921A Unspecified injury of right foot, initial encounter: Secondary | ICD-10-CM | POA: Diagnosis present

## 2017-09-24 MED ORDER — FLUCONAZOLE 150 MG PO TABS: 150.0000 mg | ORAL_TABLET | Freq: Every day | ORAL | 1 refills | Status: DC

## 2017-09-24 MED ORDER — NABUMETONE 750 MG PO TABS: 750.0000 mg | ORAL_TABLET | Freq: Two times a day (BID) | ORAL | 0 refills | Status: DC

## 2017-09-24 MED ORDER — SULFAMETHOXAZOLE-TRIMETHOPRIM 800-160 MG PO TABS: 1.0000 | ORAL_TABLET | Freq: Two times a day (BID) | ORAL | 0 refills | Status: DC

## 2017-09-24 NOTE — ED Triage Notes (Signed)
Pt reports right middle toe and foot pain for several days. Pt reports worsened pain today with swelling. Pt ambulatory but limping on right foot.

## 2017-09-24 NOTE — ED Notes (Signed)
This RN attempted to call patient to alert her that charge was left in patient room. No answer to call; patient's voicemail box is full. Charger labeled at left with first nurse. Charge RN informed.

## 2017-09-24 NOTE — ED Notes (Signed)
Patient has questions about work restrictions. RN informed patient she would discuss with provider and have provider come speak with her.

## 2017-09-24 NOTE — ED Notes (Signed)
ED Provider at bedside. 

## 2017-09-24 NOTE — ED Notes (Signed)
This RN reviewed discharge instructions, follow-up care, prescriptions, cryotherapy, and need for elevation with patient. Patient verbalized understanding of all reviewed information.  Patient stable, with no distress noted at this time. 

## 2017-09-24 NOTE — ED Notes (Signed)
Portable XR at bedside

## 2017-09-24 NOTE — ED Triage Notes (Signed)
Arrives c/o right foot swelling.  Ambulatory.  Gait steady NAD

## 2017-09-24 NOTE — ED Notes (Addendum)
Pt states pain in right foot began Wednesday and she noticed while moving her toes. Pain continued to increase until today Pt reports noticing swelling this morning at 3:34am when she woke up from the pain. Pt states the most pain is at the base of her third toe.

## 2017-09-24 NOTE — Discharge Instructions (Signed)
Your exam and x-ray are essentially normal. There is no evidence of fracture. You are being placed on antibiotics for any potential skin infection. Take the anti-inflammatory for pain and inflammation. Follow-up with podiatry for ongoing symptoms.

## 2017-09-25 NOTE — ED Provider Notes (Signed)
[facility] Emergency Department Provider Note ____________________________________________  Time seen: 2005  I have reviewed the triage vital signs and the nursing notes.  HISTORY  Chief Complaint  Foot Pain  HPI Sara Henry is a 36 y.o. female presents to the ED for evaluation of pain to the right middle toe, without known injury, trauma, or contusion. She reports pain for several days that is aggravated by walking. She denies any benefit with ibuprofen. She notes some swelling and redness to the top of the foot. She is diabetic, but denies any paresthesias or foot wounds.   Past Medical History:  Diagnosis Date  . Bell's palsy   . Diabetes mellitus without complication (HCC)     There are no active problems to display for this patient.   Past Surgical History:  Procedure Laterality Date  . WISDOM TOOTH EXTRACTION      Prior to Admission medications   Medication Sig Start Date End Date Taking? Authorizing Provider  cetirizine (ZYRTEC) 10 MG tablet Take 1 tablet (10 mg total) by mouth daily as needed for allergies. 07/09/17   Irean HongSung, Jade J, MD  fluconazole (DIFLUCAN) 150 MG tablet Take 1 tablet (150 mg total) by mouth daily. 09/24/17   Orvil FeilWoods, Jaclyn M, PA-C  ibuprofen (ADVIL,MOTRIN) 600 MG tablet Take 1 tablet (600 mg total) by mouth every 8 (eight) hours as needed. Patient not taking: Reported on 04/14/2017 11/02/16   Joni ReiningSmith, Ronald K, PA-C  metFORMIN (GLUCOPHAGE) 1000 MG tablet Take 1,000 mg by mouth 2 (two) times daily. 04/07/17   [provider]  nabumetone (RELAFEN) 750 MG tablet Take 1 tablet (750 mg total) by mouth 2 (two) times daily. 09/24/17   Corissa Oguinn, Charlesetta IvoryJenise V Bacon, PA-C  predniSONE (DELTASONE) 20 MG tablet Take 3 tablets (60 mg total) by mouth daily. 06/26/17 06/26/18  Myrna BlazerSchaevitz, David Matthew, MD  pseudoephedrine (SUDAFED) 120 MG 12 hr tablet Take 1 tablet (120 mg total) by mouth 2 (two) times daily as needed for congestion. Patient not  taking: Reported on 04/14/2017 11/02/16 11/02/17  Joni ReiningSmith, Ronald K, PA-C  sulfamethoxazole-trimethoprim (BACTRIM DS,SEPTRA DS) 800-160 MG tablet Take 1 tablet by mouth 2 (two) times daily. 09/24/17   Michala Deblanc, Charlesetta IvoryJenise V Bacon, PA-C  traMADol (ULTRAM) 50 MG tablet Take 1 tablet (50 mg total) by mouth every 6 (six) hours as needed. Patient not taking: Reported on 04/14/2017 11/02/16 11/02/17  Joni ReiningSmith, Ronald K, PA-C    Allergies Patient has no known allergies.  Family History  Problem Relation Age of Onset  . Diabetes Father     Social History Social History   Tobacco Use  . Smoking status: Never Smoker  . Smokeless tobacco: Never Used  Substance Use Topics  . Alcohol use: No    Alcohol/week: 0.0 oz  . Drug use: No    Review of Systems  Constitutional: Negative for fever. Cardiovascular: Negative for chest pain. Respiratory: Negative for shortness of breath. Musculoskeletal: Negative for back pain. Right toe pain as above. Skin: Negative for rash. Neurological: Negative for headaches, focal weakness or numbness. ____________________________________________  PHYSICAL EXAM:  VITAL SIGNS: ED Triage Vitals  Enc Vitals Group     BP 09/24/17 1847 130/67     Pulse Rate 09/24/17 1847 75     Resp 09/24/17 1847 18     Temp 09/24/17 1847 98.2 F (36.8 C)     Temp Source 09/24/17 1847 Oral     SpO2 09/24/17 1847 100 %     Weight 09/24/17 1846 250  lb (113.4 kg)     Height 09/24/17 1846 5\' 7"  (1.702 m)     Head Circumference --      Peak Flow --      Pain Score 09/24/17 1846 8     Pain Loc --      Pain Edu? --      Excl. in GC? --     Constitutional: Alert and oriented. Well appearing and in no distress. Head: Normocephalic and atraumatic. Cardiovascular: Normal rate, regular rhythm. Normal distal pulses. Respiratory: Normal respiratory effort. No wheezes/rales/rhonchi. Musculoskeletal: right foot without obvious deformity, dislocation, or edema. There is some mild STS and erythema  over the dorsal middle toe MTP. Normal toe dorsiflexion noted. No nail injury noted. No plantar surface wounds or calluses noted.  Nontender with normal range of motion in all extremities.  Neurologic:  Antalgic gait without ataxia. Normal speech and language. No gross focal neurologic deficits are appreciated. Skin:  Skin is warm, dry and intact. No rash noted. ___________________________________________   RADIOLOGY  Right Foot  IMPRESSION: No acute bony findings. ____________________________________________  PROCEDURES  Post-op Shoe ____________________________________________  INITIAL IMPRESSION / ASSESSMENT AND PLAN / ED COURSE  Patient with ED evaluation of right foot pain at the middle toe. No known injury or trauma. Her exam and x-ray are negative for acute fracture or dislocation. Her exam is somewhat suspicious of an early infectious process. She is placed on Relafen and Bactrim DS, as well as diflucan for yeast. She is placed in a post-op shoe and referred to podiatry for further evaluation. A work note for 2 days is provided, as requested.  ____________________________________________  FINAL CLINICAL IMPRESSION(S) / ED DIAGNOSES  Final diagnoses:  Sprain of right foot, initial encounter  Pain of toe of right foot     Lissa HoardMenshew, Kinsly Hild V Bacon, PA-C 09/25/17 0042    Phineas SemenGoodman, Graydon, MD 09/25/17 [phone]

## 2017-11-17 ENCOUNTER — Ambulatory Visit: Payer: Self-pay | Admitting: Family Medicine

## 2017-11-18 ENCOUNTER — Ambulatory Visit: Payer: Self-pay | Admitting: Family Medicine

## 2018-01-18 DIAGNOSIS — N921 Excessive and frequent menstruation with irregular cycle: Secondary | ICD-10-CM | POA: Insufficient documentation

## 2018-01-18 DIAGNOSIS — Z6839 Body mass index (BMI) 39.0-39.9, adult: Secondary | ICD-10-CM | POA: Insufficient documentation

## 2018-01-18 DIAGNOSIS — Z8742 Personal history of other diseases of the female genital tract: Secondary | ICD-10-CM | POA: Insufficient documentation

## 2018-01-18 DIAGNOSIS — Q519 Congenital malformation of uterus and cervix, unspecified: Secondary | ICD-10-CM | POA: Insufficient documentation

## 2018-02-08 DIAGNOSIS — N970 Female infertility associated with anovulation: Secondary | ICD-10-CM | POA: Insufficient documentation

## 2018-02-19 ENCOUNTER — Emergency Department: Payer: BLUE CROSS/BLUE SHIELD

## 2018-02-19 ENCOUNTER — Other Ambulatory Visit: Payer: Self-pay

## 2018-02-19 ENCOUNTER — Encounter: Payer: Self-pay | Admitting: Emergency Medicine

## 2018-02-19 DIAGNOSIS — R079 Chest pain, unspecified: Secondary | ICD-10-CM | POA: Insufficient documentation

## 2018-02-19 DIAGNOSIS — E119 Type 2 diabetes mellitus without complications: Secondary | ICD-10-CM | POA: Insufficient documentation

## 2018-02-19 DIAGNOSIS — Z7984 Long term (current) use of oral hypoglycemic drugs: Secondary | ICD-10-CM | POA: Insufficient documentation

## 2018-02-19 LAB — BASIC METABOLIC PANEL
Anion gap: 7 (ref 5–15)
BUN: 9 mg/dL (ref 6–20)
CALCIUM: 8.8 mg/dL — AB (ref 8.9–10.3)
CHLORIDE: 105 mmol/L (ref 101–111)
CO2: 26 mmol/L (ref 22–32)
CREATININE: 0.63 mg/dL (ref 0.44–1.00)
GFR calc Af Amer: >60 mL/min (ref >60)
GFR calc non Af Amer: >60 mL/min (ref >60)
Glucose, Bld: 125 mg/dL — ABNORMAL HIGH (ref 65–99)
Potassium: 3.4 mmol/L — ABNORMAL LOW (ref 3.5–5.1)
Sodium: 138 mmol/L (ref 135–145)

## 2018-02-19 LAB — CBC
HEMATOCRIT: 30.7 % — AB (ref 35.0–47.0)
HEMOGLOBIN: 10 g/dL — AB (ref 12.0–16.0)
MCH: 26.3 pg (ref 26.0–34.0)
MCHC: 32.4 g/dL (ref 32.0–36.0)
MCV: 81 fL (ref 80.0–100.0)
Platelets: 369 10*3/uL (ref 150–440)
RBC: 3.79 MIL/uL — AB (ref 3.80–5.20)
RDW: 18 % — AB (ref 11.5–14.5)
WBC: 8.3 10*3/uL (ref 3.6–11.0)

## 2018-02-19 LAB — TROPONIN I: Troponin I: <0.03 ng/mL (ref <0.03)

## 2018-02-19 NOTE — ED Triage Notes (Signed)
Pt says she was driving home from work in [place] about 40 minutes ago and had sudden onset of nonradiating left sided chest pain; says pain is sharp; feeling short of breath; denies N/V or diaphoresis; pt reports no history of same;

## 2018-02-20 ENCOUNTER — Emergency Department
Admission: EM | Admit: 2018-02-20 | Discharge: 2018-02-20 | Disposition: A | Discharge status: Home / Self Care | Payer: BLUE CROSS/BLUE SHIELD | Attending: Emergency Medicine | Admitting: Emergency Medicine

## 2018-02-20 DIAGNOSIS — R079 Chest pain, unspecified: Secondary | ICD-10-CM

## 2018-02-20 LAB — TROPONIN I

## 2018-02-20 MED ORDER — GI COCKTAIL ~~LOC~~
30.0000 mL | Freq: Once | ORAL | Status: AC
  Administered 2018-02-20: 30 mL via ORAL

## 2018-02-20 MED ORDER — GI COCKTAIL ~~LOC~~
ORAL | Status: AC
  Filled 2018-02-20: qty 30

## 2018-02-20 MED ORDER — SUCRALFATE 1 G PO TABS
1.0000 g | ORAL_TABLET | Freq: Once | ORAL | Status: AC
  Administered 2018-02-20: 1 g via ORAL

## 2018-02-20 MED ORDER — FAMOTIDINE 40 MG PO TABS: 40.0000 mg | ORAL_TABLET | Freq: Every evening | ORAL | 0 refills | Status: DC

## 2018-02-20 MED ORDER — SUCRALFATE 1 G PO TABS: 1.0000 g | ORAL_TABLET | Freq: Two times a day (BID) | ORAL | 0 refills | Status: AC

## 2018-02-20 MED ORDER — SUCRALFATE 1 G PO TABS
ORAL_TABLET | ORAL | Status: AC
  Filled 2018-02-20: qty 1

## 2018-02-20 NOTE — Discharge Instructions (Addendum)
Your work up is negative currently. Please follow up with your primary care physician for further evaluation of your symptoms.

## 2018-02-20 NOTE — ED Provider Notes (Signed)
[facility] Emergency Department Provider Note   ____________________________________________   First MD Initiated Contact with Patient 02/20/18 0024     (approximate)  I have reviewed the triage vital signs and the nursing notes.   HISTORY  Chief Complaint Chest Pain    HPI Sara Henry is a 37 y.o. female who comes into the hospital today with some sharp left-sided chest pain.  The patient was driving home and the pain would not go away so she decided to come here.  The patient denies any radiation of the pain and states it is under her left breast.  The patient states that it was hard for her to breathe but she denies any sweats dizziness or lightheadedness nausea or vomiting.  The patient rates her pain currently a 3 out of 10 in intensity.  She is never had this before.  The patient denies any abdominal pain or pain with urination.  She also denies any back pain.  The patient is here for evaluation.  Past Medical History:  Diagnosis Date  . Bell's palsy   . Diabetes mellitus without complication (HCC)     There are no active problems to display for this patient.   Past Surgical History:  Procedure Laterality Date  . WISDOM TOOTH EXTRACTION      Prior to Admission medications   Medication Sig Start Date End Date Taking? Authorizing Provider  metFORMIN (GLUCOPHAGE) 1000 MG tablet Take 1,000 mg by mouth 2 (two) times daily. 04/07/17  Yes [provider]    Allergies Patient has no known allergies.  Family History  Problem Relation Age of Onset  . Diabetes Father     Social History Social History   Tobacco Use  . Smoking status: Never Smoker  . Smokeless tobacco: Never Used  Substance Use Topics  . Alcohol use: No    Alcohol/week: 0.0 oz  . Drug use: No    Review of Systems  Constitutional: No fever/chills Eyes: No visual changes. ENT: No sore throat. Cardiovascular:  chest pain. Respiratory:  shortness of  breath. Gastrointestinal: No abdominal pain.  No nausea, no vomiting.  No diarrhea.  No constipation. Genitourinary: Negative for dysuria. Musculoskeletal: Negative for back pain. Skin: Negative for rash. Neurological: Negative for headaches, focal weakness or numbness.   ____________________________________________   PHYSICAL EXAM:  VITAL SIGNS: ED Triage Vitals  Enc Vitals Group     BP 02/19/18 2152 127/72     Pulse Rate 02/19/18 2152 78     Resp 02/19/18 2152 17     Temp 02/19/18 2152 97.9 F (36.6 C)     Temp Source 02/19/18 2152 Oral     SpO2 02/19/18 2152 99 %     Weight 02/19/18 2153 250 lb (113.4 kg)     Height 02/19/18 2153  (1.702 m)     Head Circumference --      Peak Flow --      Pain Score 02/19/18 2153 5     Pain Loc --      Pain Edu? --      Excl. in GC? --     Constitutional: Alert and oriented. Well appearing and in mild distress. Eyes: Conjunctivae are normal. PERRL. EOMI. Head: Atraumatic. Nose: No congestion/rhinnorhea. Mouth/Throat: Mucous membranes are moist.  Oropharynx non-erythematous. Cardiovascular: Normal rate, regular rhythm. Grossly normal heart sounds.  Good peripheral circulation. Respiratory: Normal respiratory effort.  No retractions. Lungs CTAB. Gastrointestinal: Soft and nontender. No distention.  Positive bowel sounds  Musculoskeletal: No lower extremity tenderness nor edema.   Neurologic:  Normal speech and language.  Skin:  Skin is warm, dry and intact.  Psychiatric: Mood and affect are normal.   ____________________________________________   LABS (all labs ordered are listed, but only abnormal results are displayed)  Labs Reviewed  BASIC METABOLIC PANEL - Abnormal; Notable for the following components:      Result Value   Potassium 3.4 (*)    Glucose, Bld 125 (*)    Calcium 8.8 (*)    All other components within normal limits  CBC - Abnormal; Notable for the following components:   RBC 3.79 (*)    Hemoglobin 10.0  (*)    HCT 30.7 (*)    RDW 18.0 (*)    All other components within normal limits  TROPONIN I  TROPONIN I   ____________________________________________  EKG  ED ECG REPORT I, Rebecka Apley, the attending physician, personally viewed and interpreted this ECG.   Date: 02/19/2018  EKG Time: 2155  Rate: 72  Rhythm: normal sinus rhythm  Axis: normal  Intervals:none  ST&T Change: none  ____________________________________________  RADIOLOGY  ED MD interpretation:  CXR: No active cardiopulmonary disease  Official radiology report(s): Dg Chest 2 View  Result Date: 02/19/2018 CLINICAL DATA:  Sudden onset nonradiating left-sided chest pain. EXAM: CHEST - 2 VIEW COMPARISON:  None. FINDINGS: The heart size and mediastinal contours are within normal limits. Lung volumes are slightly low with crowding of interstitial lung markings. No alveolar consolidation or overt pulmonary edema. The visualized skeletal structures are unremarkable. IMPRESSION: No active cardiopulmonary disease. Electronically Signed   By: Tollie Eth M.D.   On: 02/19/2018 22:16    ____________________________________________   PROCEDURES  Procedure(s) performed: None  Procedures  Critical Care performed: No  ____________________________________________   INITIAL IMPRESSION / ASSESSMENT AND PLAN / ED COURSE  As part of my medical decision making, I reviewed the following data within the electronic MEDICAL RECORD NUMBER Notes from prior ED visits and [place] Controlled Substance Database   This is a 37 year old female who comes into the hospital today with some chest pain.  My differential diagnosis includes acute coronary syndrome, gastritis, musculoskeletal pain, pneumonia.  We did check some blood work on the patient to include a CBC troponin and a BMP.  The patient's blood work is unremarkable.  She does have a hemoglobin of 10.  The patient also had a chest x-ray which was unremarkable.  I did give her  the patient a GI cocktail and some Carafate.  We repeated her troponin which was unremarkable.  The patient was sleeping comfortably.  She will be discharged home and encouraged to follow-up with her primary care physician.      ____________________________________________   FINAL CLINICAL IMPRESSION(S) / ED DIAGNOSES  Final diagnoses:  Chest pain, unspecified type     ED Discharge Orders    None       Note:  This document was prepared using Dragon voice recognition software and may include unintentional dictation errors.    Rebecka Apley, MD 02/20/18 [phone]

## 2018-07-09 IMAGING — CR DG CHEST 2V
1 series · 2 of 2 positions shown · non-contrast
Comparison: None.

CLINICAL DATA: Sudden onset nonradiating left-sided chest pain.

EXAM:
CHEST - 2 VIEW

[Series 1: dg chest 2 view · 0.14mm/px · 2 of 2 slices shown]
[im 1/2]
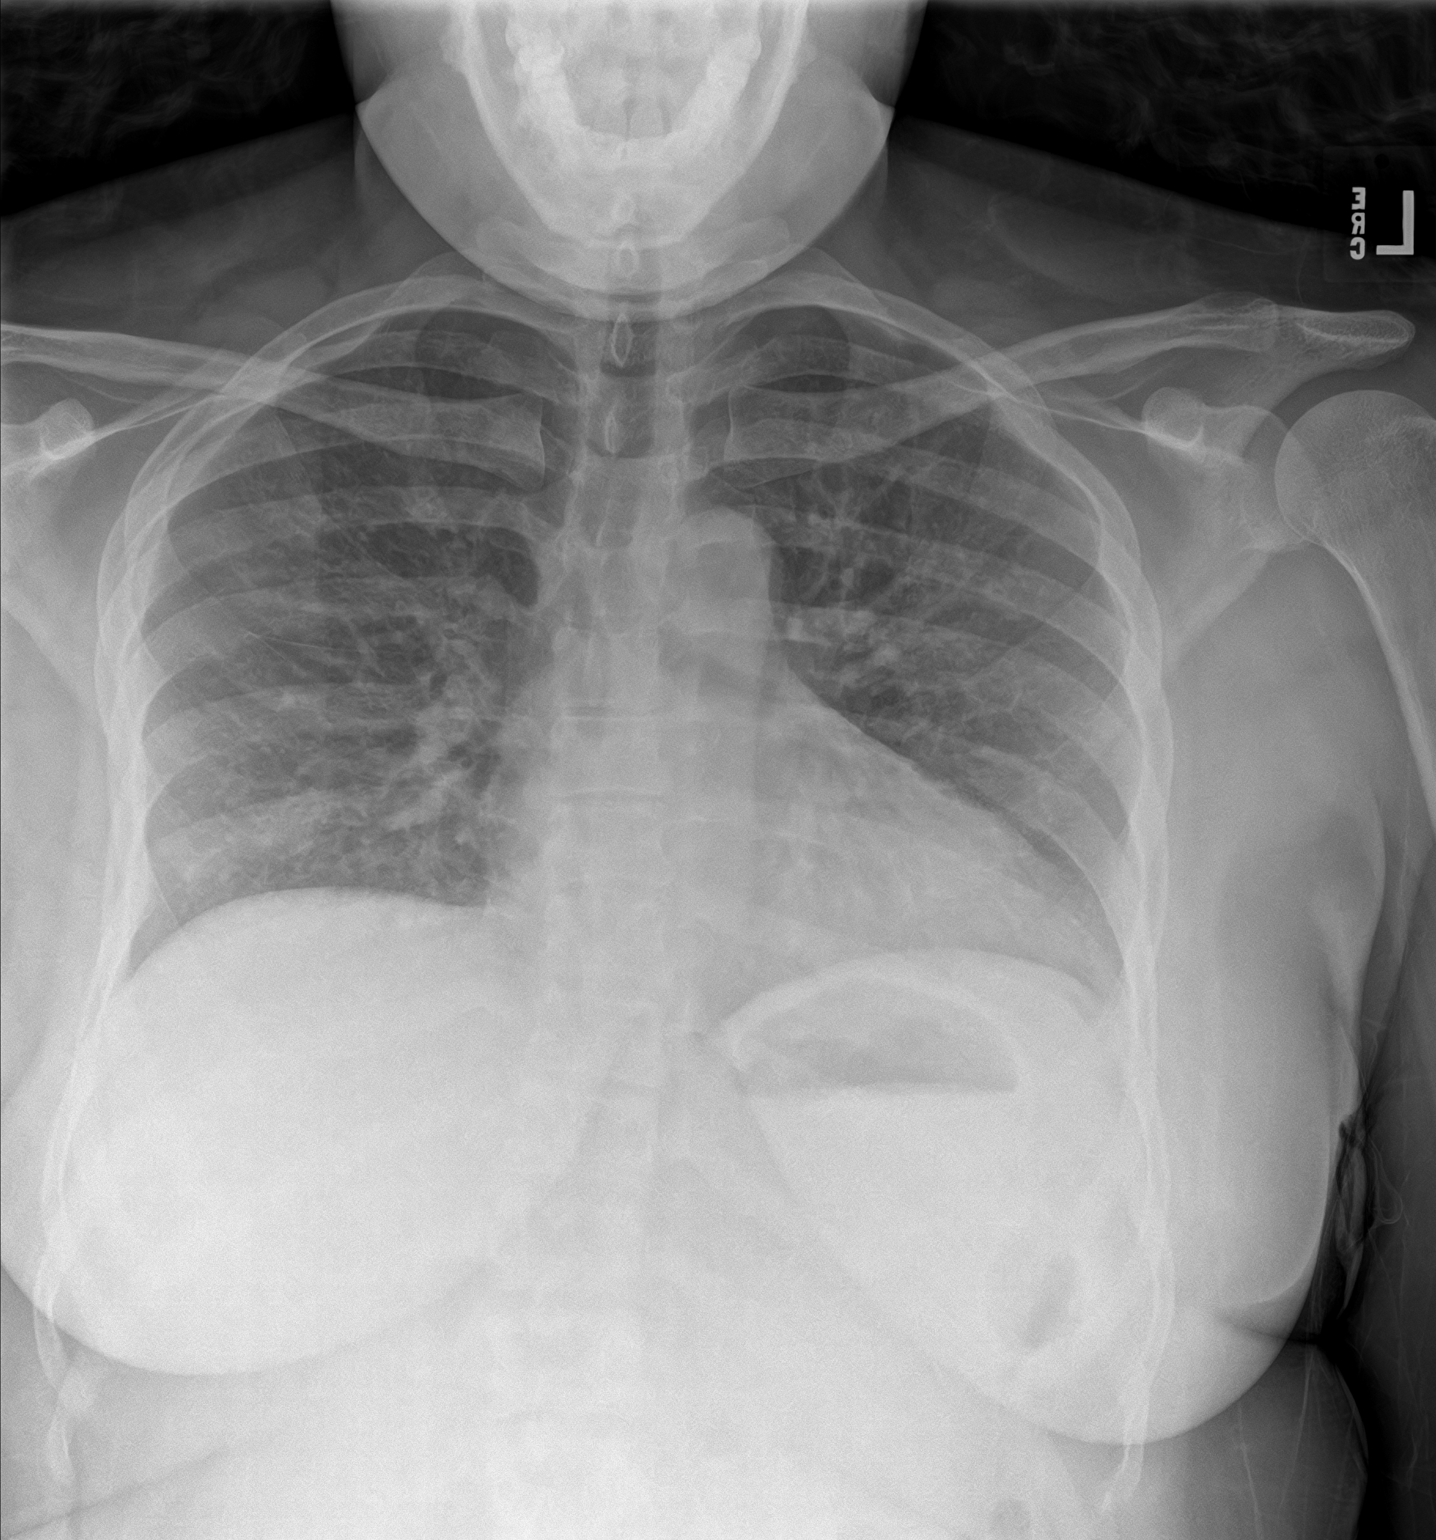
[im 2/2]
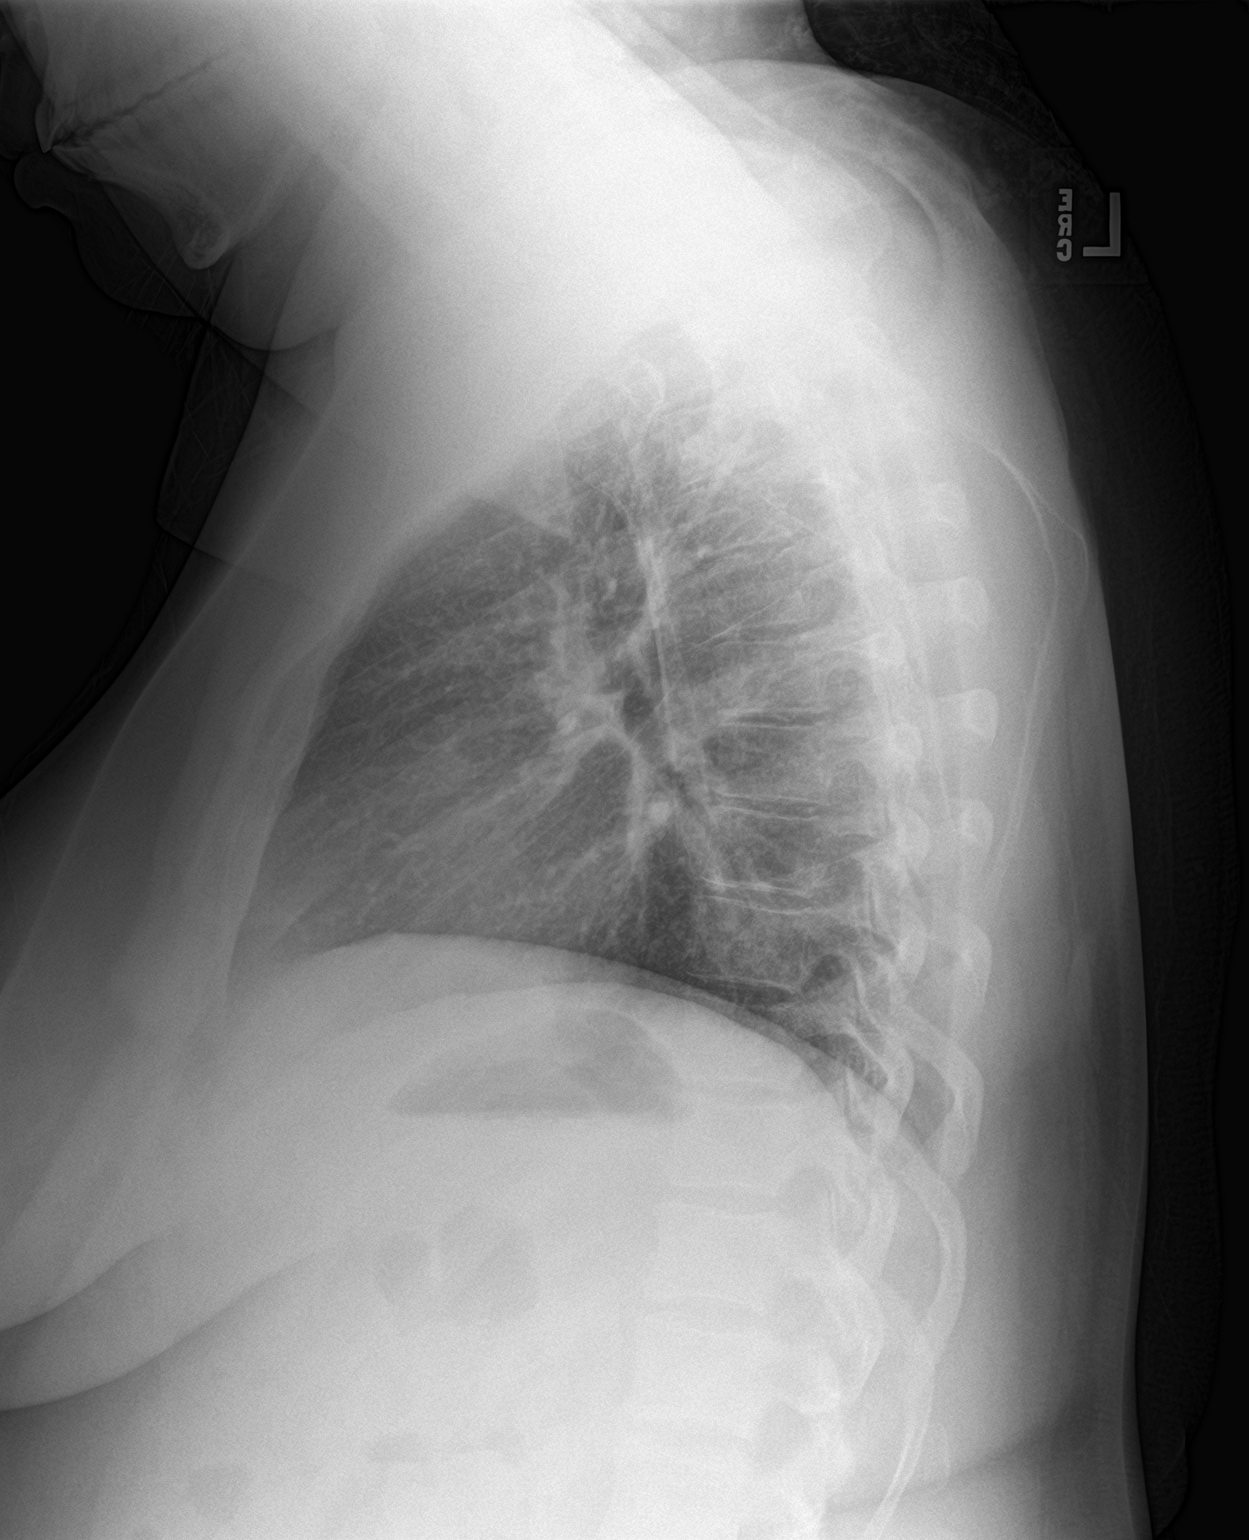

[2 of 2 positions shown; findings below may reference images not displayed]

FINDINGS: The heart size and mediastinal contours are within normal limits.
Lung volumes are slightly low with crowding of interstitial lung
markings. No alveolar consolidation or overt pulmonary edema. The
visualized skeletal structures are unremarkable.
IMPRESSION: No active cardiopulmonary disease.

## 2019-04-20 ENCOUNTER — Ambulatory Visit (INDEPENDENT_AMBULATORY_CARE_PROVIDER_SITE_OTHER): Payer: BC Managed Care – PPO | Admitting: Surgery

## 2019-04-20 ENCOUNTER — Ambulatory Visit: Payer: BLUE CROSS/BLUE SHIELD | Admitting: Surgery

## 2019-04-20 ENCOUNTER — Other Ambulatory Visit: Payer: Self-pay

## 2019-04-20 ENCOUNTER — Encounter: Payer: Self-pay | Admitting: Surgery

## 2019-04-20 VITALS — BP 159/89 | HR 89 | Temp 97.0°F | Resp 18 | Ht 67.0 in | Wt 273.0 lb

## 2019-04-20 DIAGNOSIS — L723 Sebaceous cyst: Secondary | ICD-10-CM

## 2019-04-20 NOTE — Progress Notes (Signed)
04/20/2019  Reason for Visit:  Right axillary cysts  Referring Provider:  Evelena LeydenStefanie Xenakis, PA-C  History of Present Illness: Sara Henry is a 38 y.o. female presenting for evaluation of right axillary cysts.  She has a history of prior I&D procedures, once done by Dr. Evette CristalSankar in 2016.  She most recently had an I&D in 02/2019 by her PCP.  She currently is doing well, without any symptoms, but wanted to be evaluate for cyst excision before a new flare up happens.  She denies any fevers, chills, pain in the right axilla, drainage, or other concerns.  She has never had any issues in the left axilla or either groin area.  Past Medical History: Past Medical History:  Diagnosis Date  . Bell's palsy   . Diabetes mellitus without complication [facility](HCC)      Past Surgical History: Past Surgical History:  Procedure Laterality Date  . WISDOM TOOTH EXTRACTION      Home Medications: Prior to Admission medications   Medication Sig Start Date End Date Taking? Authorizing Provider  metFORMIN (GLUCOPHAGE) 1000 MG tablet Take 1,000 mg by mouth 2 (two) times daily. 04/07/17  Yes [provider]  famotidine (PEPCID) 40 MG tablet Take 1 tablet (40 mg total) by mouth every evening. 02/20/18 02/20/19  Rebecka ApleyWebster, Allison P, MD  sucralfate (CARAFATE) 1 g tablet Take 1 tablet (1 g total) by mouth 2 (two) times daily. 02/20/18   Rebecka ApleyWebster, Allison P, MD    Allergies: No Known Allergies  Social History:  reports that she has never smoked. She has never used smokeless tobacco. She reports that she does not drink alcohol or use drugs.   Family History: Family History  Problem Relation Age of Onset  . Diabetes Father     Review of Systems: Review of Systems  Constitutional: Negative for chills and fever.  HENT: Negative for hearing loss.   Respiratory: Negative for shortness of breath.   Cardiovascular: Negative for chest pain.  Gastrointestinal: Negative for abdominal pain, nausea and vomiting.   Genitourinary: Negative for dysuria.  Musculoskeletal: Negative for myalgias.  Skin:       Right axillary I&D's  Neurological: Negative for dizziness.  Psychiatric/Behavioral: Negative for depression.    Physical Exam BP (!) 159/89   Pulse 89   Temp (!) 97 F (36.1 C) (Temporal)   Resp 18   Ht 5\' 7"  (1.702 m)   Wt 273 lb (123.8 kg)   LMP 04/14/2019   SpO2 96%   BMI 42.76 kg/m  CONSTITUTIONAL: No acute distress HEENT:  Normocephalic, atraumatic, extraocular motion intact. NECK: Trachea is midline, and there is no jugular venous distension.  RESPIRATORY:  Lungs are clear, and breath sounds are equal bilaterally. Normal respiratory effort without pathologic use of accessory muscles. CARDIOVASCULAR: Heart is regular without murmurs, gallops, or rubs. GI: The abdomen is soft, obese, non-distended, non-tender to palpation.  MUSCULOSKELETAL:  Normal muscle strength and tone in all four extremities.  No peripheral edema or cyanosis. SKIN: Right axilla reveals two areas of scar tissue from prior I&D, one medially, and one superiorly.  There is a small nodule next to each scar, with a small pore opening to the skin, with no drainage currently.  There was no tenderness to palpation, fluctuance, induration, or cellulitis.  Left axilla was negative.  NEUROLOGIC:  Motor and sensation is grossly normal.  Cranial nerves are grossly intact. PSYCH:  Alert and oriented to person, place and time. Affect is normal.  Laboratory Analysis: No results found  for this or any previous visit (from the past 24 hour(s)).  Imaging: No results found.  Assessment and Plan: This is a 38 y.o. female with right axillary cysts  Discussed with the patient that these could be the beginning of hydradenitis, though she does not have any affected areas in the left axilla or either groin.  This could otherwise be sebaceous cysts of the axilla.  We can perform excision of both nodules in the right axilla, and will set  her up for a procedure visit next week for excision.  Discussed with her that we would excise the cysts and close skin with sutures, but there is a risk that the wounds open up given the location.  She understands.  She will be scheduled for 7/14.  Face-to-face time spent with the patient and care providers was 40 minutes, with more than 50% of the time spent counseling, educating, and coordinating care of the patient.     Melvyn Neth, [facility]

## 2019-04-20 NOTE — Patient Instructions (Signed)
You may try applying warm compresses to the area.  Please see your appointment listed below.    Epidermal Cyst Removal, Care After This sheet gives you information about how to care for yourself after your procedure. Your health care provider may also give you more specific instructions. If you have problems or questions, contact your health care provider. What can I expect after the procedure? After the procedure, it is common to have:  Soreness in the area where your cyst was removed.  Tightness or itchiness from the stitches (sutures) in your skin. Follow these instructions at home: Medicines  Take over-the-counter and prescription medicines only as told by your health care provider.  If you were prescribed an antibiotic medicine or ointment, take or apply it as told by your health care provider. Do not stop using the antibiotic even if you start to feel better. Incision care   Follow instructions from your health care provider about how to take care of your incision. Make sure you: ? Wash your hands with soap and water before you change your bandage (dressing). If soap and water are not available, use hand sanitizer. ? Change your dressing as told by your health care provider. ? Leave sutures, skin glue, or adhesive strips in place. These skin closures may need to stay in place for 1-2 weeks or longer. If adhesive strip edges start to loosen and curl up, you may trim the loose edges. Do not remove adhesive strips completely unless your health care provider tells you to do that.  Keep the dressingdry until your health care provider says that it can be removed.  After your dressing is off, check your incision area every day for signs of infection. Check for: ? Redness, swelling, or pain. ? Fluid or blood. ? Warmth. ? Pus or a bad smell. General instructions  Do not take baths, swim, or use a hot tub until your health care provider approves. Ask your health care provider if you  may take showers. You may only be allowed to take sponge baths.  Your health care provider may ask you to avoid contact sports or activities that take a lot of effort. Do not do anything that stretches or puts pressure on your incision.  You can return to your normal diet.  Keep all follow-up visits as told by your health care provider. This is important. Contact a health care provider if:  You have a fever.  You have redness, swelling, or pain in the incision area.  You have fluid or blood coming from your incision.  You have pus or a bad smell coming from your incision.  Your incision feels warm to the touch.  Your cyst grows back. Summary  After the procedure, it is common to have soreness in the area where your cyst was removed.  Take or apply over-the-counter and prescription medicines only as told by your health care provider.  Follow instructions from your health care provider about how to take care of your incision. This information is not intended to replace advice given to you by your health care provider. Make sure you discuss any questions you have with your health care provider. Document Released: 10/19/2014 Document Revised: 01/18/2018 Document Reviewed: 07/22/2017 Elsevier Patient Education  2020 [employer].

## 2019-04-25 ENCOUNTER — Ambulatory Visit: Payer: Self-pay | Admitting: Surgery

## 2019-05-29 ENCOUNTER — Other Ambulatory Visit: Payer: Self-pay

## 2019-05-29 DIAGNOSIS — Z20822 Contact with and (suspected) exposure to covid-19: Secondary | ICD-10-CM

## 2019-05-30 LAB — NOVEL CORONAVIRUS, NAA: SARS-CoV-2, NAA: NOT DETECTED

## 2019-06-29 ENCOUNTER — Encounter: Payer: Self-pay | Admitting: *Deleted

## 2020-11-04 ENCOUNTER — Other Ambulatory Visit: Payer: Self-pay

## 2020-11-04 ENCOUNTER — Encounter: Payer: Self-pay | Admitting: Emergency Medicine

## 2020-11-04 ENCOUNTER — Emergency Department
Admission: EM | Admit: 2020-11-04 | Discharge: 2020-11-04 | Disposition: A | Discharge status: Home / Self Care | Payer: BC Managed Care – PPO | Attending: Emergency Medicine | Admitting: Emergency Medicine

## 2020-11-04 DIAGNOSIS — E119 Type 2 diabetes mellitus without complications: Secondary | ICD-10-CM | POA: Insufficient documentation

## 2020-11-04 DIAGNOSIS — B373 Candidiasis of vulva and vagina: Secondary | ICD-10-CM | POA: Diagnosis not present

## 2020-11-04 DIAGNOSIS — K625 Hemorrhage of anus and rectum: Secondary | ICD-10-CM | POA: Diagnosis not present

## 2020-11-04 DIAGNOSIS — Z7984 Long term (current) use of oral hypoglycemic drugs: Secondary | ICD-10-CM | POA: Insufficient documentation

## 2020-11-04 DIAGNOSIS — N898 Other specified noninflammatory disorders of vagina: Secondary | ICD-10-CM | POA: Diagnosis present

## 2020-11-04 DIAGNOSIS — B3731 Acute candidiasis of vulva and vagina: Secondary | ICD-10-CM

## 2020-11-04 LAB — WET PREP, GENITAL
Clue Cells Wet Prep HPF POC: NONE SEEN
Sperm: NONE SEEN
Trich, Wet Prep: NONE SEEN

## 2020-11-04 LAB — COMPREHENSIVE METABOLIC PANEL
ALT: 46 U/L — ABNORMAL HIGH (ref 0–44)
AST: 28 U/L (ref 15–41)
Albumin: 4.2 g/dL (ref 3.5–5.0)
Alkaline Phosphatase: 94 U/L (ref 38–126)
Anion gap: 11 (ref 5–15)
BUN: 10 mg/dL (ref 6–20)
CO2: 23 mmol/L (ref 22–32)
Calcium: 9.2 mg/dL (ref 8.9–10.3)
Chloride: 100 mmol/L (ref 98–111)
Creatinine, Ser: 0.72 mg/dL (ref 0.44–1.00)
GFR, Estimated: >60 mL/min (ref >60)
Glucose, Bld: 365 mg/dL — ABNORMAL HIGH (ref 70–99)
Potassium: 4 mmol/L (ref 3.5–5.1)
Sodium: 134 mmol/L — ABNORMAL LOW (ref 135–145)
Total Bilirubin: 0.7 mg/dL (ref 0.3–1.2)
Total Protein: 8.1 g/dL (ref 6.5–8.1)

## 2020-11-04 LAB — CBC
HCT: 33.8 % — ABNORMAL LOW (ref 36.0–46.0)
Hemoglobin: 10.8 g/dL — ABNORMAL LOW (ref 12.0–15.0)
MCH: 25.4 pg — ABNORMAL LOW (ref 26.0–34.0)
MCHC: 32 g/dL (ref 30.0–36.0)
MCV: 79.5 fL — ABNORMAL LOW (ref 80.0–100.0)
Platelets: 327 10*3/uL (ref 150–400)
RBC: 4.25 MIL/uL (ref 3.87–5.11)
RDW: 13.8 % (ref 11.5–15.5)
WBC: 7 10*3/uL (ref 4.0–10.5)
nRBC: 0 % (ref 0.0–0.2)

## 2020-11-04 LAB — CHLAMYDIA/NGC RT PCR (ARMC ONLY)
Chlamydia Tr: NOT DETECTED
N gonorrhoeae: NOT DETECTED

## 2020-11-04 LAB — TYPE AND SCREEN
ABO/RH(D): A NEG
Antibody Screen: NEGATIVE

## 2020-11-04 LAB — POC URINE PREG, ED: Preg Test, Ur: NEGATIVE

## 2020-11-04 MED ORDER — DOCUSATE SODIUM 100 MG PO CAPS: 100.0000 mg | ORAL_CAPSULE | Freq: Two times a day (BID) | ORAL | 2 refills | Status: AC

## 2020-11-04 MED ORDER — HYDROCORTISONE ACETATE 25 MG RE SUPP: 25.0000 mg | Freq: Two times a day (BID) | RECTAL | 1 refills | Status: AC

## 2020-11-04 MED ORDER — FLUCONAZOLE 50 MG PO TABS
150.0000 mg | ORAL_TABLET | Freq: Once | ORAL | Status: AC
  Administered 2020-11-04: 150 mg via ORAL
  Filled 2020-11-04: qty 1

## 2020-11-04 MED ORDER — FLUCONAZOLE 150 MG PO TABS: 150.0000 mg | ORAL_TABLET | Freq: Every day | ORAL | 0 refills | Status: DC

## 2020-11-04 MED ORDER — METFORMIN HCL 1000 MG PO TABS: 1000.0000 mg | ORAL_TABLET | Freq: Two times a day (BID) | ORAL | 11 refills | Status: AC

## 2020-11-04 NOTE — ED Notes (Signed)
Patient verbalizes understanding of discharge instructions. Opportunity for questioning and answers were provided. Armband removed by staff, pt discharged from ED. Ambulated out to lobby  

## 2020-11-04 NOTE — ED Provider Notes (Signed)
[facility] Emergency Department Provider Note  Time seen: 9:18 AM  I have reviewed the triage vital signs and the nursing notes.   HISTORY  Chief Complaint Rectal Bleeding and vaginal irritation   HPI Sara Henry is a 40 y.o. female with a past medical history of diabetes who presents to the emergency department for vaginal discharge irritation as well as rectal bleeding.  According to the patient over the past week or so she has been very constipated has been trying stool softeners and laxatives with little result.  Patient states when straining she has noticed a small amount of blood in the toilet, tried a suppository and states pain when she had a bowel movement.  Patient is not sure if she has hemorrhoids, came for evaluation of this.  Patient also states for the past 1 month she has been off of her metformin her doctor's appointment is not until next week.  Patient states for the past week or so she has been experiencing vaginal irritation and burning.  States she has now developed a rash.   Past Medical History:  Diagnosis Date  . Bell's palsy   . Diabetes mellitus without complication [facility])     Patient Active Problem List   Diagnosis Date Noted  . Infertility, anovulation 02/08/2018  . BMI 39.0-39.9,adult 01/18/2018  . 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 History of infertility 01/18/2018  . Metrorrhagia 01/18/2018  . Abnormal uterine bleeding 04/03/2016  . Menorrhagia 04/02/2016  . Type 2 diabetes mellitus (HCC) 04/02/2016  . Vitamin D deficiency 04/02/2016  . Type 2 diabetes mellitus not at goal [facility]) 03/19/2015  . Bell's palsy 04/01/2012    Past Surgical History:  Procedure Laterality Date  . WISDOM TOOTH EXTRACTION      Prior to Admission medications   Medication Sig Start Date End Date Taking? Authorizing Provider  famotidine (PEPCID) 40 MG tablet Take 1 tablet (40 mg total) by mouth every evening.  02/20/18 02/20/19  Rebecka Apley, MD  metFORMIN (GLUCOPHAGE) 1000 MG tablet Take 1,000 mg by mouth 2 (two) times daily. 04/07/17   [provider]  sucralfate (CARAFATE) 1 g tablet Take 1 tablet (1 g total) by mouth 2 (two) times daily. 02/20/18   Rebecka Apley, MD    No Known Allergies  Family History  Problem Relation Age of Onset  . Diabetes Father     Social History Social History   Tobacco Use  . Smoking status: Never Smoker  . Smokeless tobacco: Never Used  Substance Use Topics  . Alcohol use: No    Alcohol/week: 0.0 standard drinks  . Drug use: No    Review of Systems Constitutional: Negative for fever. Cardiovascular: Negative for chest pain. Respiratory: Negative for shortness of breath. Gastrointestinal: Negative for abdominal pain.  Positive for rectal bleeding with straining. Genitourinary: Vaginal irritation and rash. Musculoskeletal: Negative for musculoskeletal complaints Neurological: Negative for headache All other ROS negative  ____________________________________________   PHYSICAL EXAM:  VITAL SIGNS: ED Triage Vitals  Enc Vitals Group     BP 11/04/20 0545 128/82     Pulse Rate 11/04/20 0545 72     Resp 11/04/20 0545 16     Temp 11/04/20 0548 98 F (36.7 C)     Temp Source 11/04/20 0548 Oral     SpO2 11/04/20 0545 97 %     Weight 11/04/20 0544 255 lb (115.7 kg)     Height 11/04/20 0544 5\' 7"  (1.702 m)  Head Circumference --      Peak Flow --      Pain Score 11/04/20 0544 5     Pain Loc --      Pain Edu? --      Excl. in [place]? --    Constitutional: Alert and oriented. Well appearing and in no distress. Eyes: Normal exam ENT      Head: Normocephalic and atraumatic.      Mouth/Throat: Mucous membranes are moist. Cardiovascular: Normal rate, regular rhythm. Respiratory: Normal respiratory effort without tachypnea nor retractions. Breath sounds are clear  Gastrointestinal: Soft and nontender. No distention.   Musculoskeletal: Nontender with normal range of motion in all extremities.  Neurologic:  Normal speech and language. No gross focal neurologic deficits Skin:  Skin is warm, dry and intact.  Psychiatric: Mood and affect are normal.   ____________________________________________   INITIAL IMPRESSION / ASSESSMENT AND PLAN / ED COURSE  Pertinent labs & imaging results that were available during my care of the patient were reviewed by me and considered in my medical decision making (see chart for details).   Patient presents to the emergency department for rectal bleeding and vaginal irritation.  Patient's blood sugar on chemistry is 365, normal anion gap.  Patient has been off of her Metformin for 1 month.  We will restart the patient's Metformin.  We will perform a pelvic exam to send swabs, suspect likely Candida infection given rash and elevated blood glucose.  We will perform a rectal examination.  Reassuringly patient's lab work is normal with a unchanged H&H.  On rectal examination patient does have a small external hemorrhoid, no other concerning findings.  Patient has vaginal irritation on pelvic exam with a mild amount of discharge.  Wet prep and GC/chlamydia been sent.  Wet prep consistent with yeast infection.  We will dose Diflucan.  We will restart the patient's Metformin.  Discussed stool softeners as well as MiraLAX as needed.  Patient is to follow-up with her doctor.  Sara Henry was evaluated in Emergency Department on 11/04/2020 for the symptoms described in the history of present illness. She was evaluated in the context of the global COVID-19 pandemic, which necessitated consideration that the patient might be at risk for infection with the SARS-CoV-2 virus that causes COVID-19. Institutional protocols and algorithms that pertain to the evaluation of patients at risk for COVID-19 are in a state of rapid change based on information released by regulatory bodies including the CDC  and federal and state organizations. These policies and algorithms were followed during the patient's care in the ED.  ____________________________________________   FINAL CLINICAL IMPRESSION(S) / ED DIAGNOSES  Rectal bleeding Vaginal irritation   Minna Antis, MD 11/04/20 1042

## 2020-11-04 NOTE — ED Notes (Signed)
Says initially pain and some rectal bleeding she thought could be hemorrhoids.  She did suppository.  Then now she thinks she has a blockage because she can't have bm.  And now her vagina is irritated.

## 2020-11-04 NOTE — ED Triage Notes (Signed)
Pt to ED via POV stating that she is having rectal bleeding and vaginal irritation. Pt reports that she thinks she has a hemorrhoid. Pt reports painful bowel movements. Pt states that she is not having hard stools but has been taking stool softeners to try to help it be less painful to go. Pt reports that the blood from her rectum is bright red with dark red strings in it. Pt is in NAD.

## 2020-11-04 NOTE — Discharge Instructions (Signed)
As we discussed please use your stool softener twice daily as needed for hard stool.  Please use MiraLAX if needed for constipation.  Please use your prescribed suppositories for hemorrhoidal relief.  You have been diagnosed with a yeast infection you have been given a one-time dose of medication in the emergency department.  You have also been prescribed a second dose to be taken in 1 week.  Please follow-up with your doctor regarding today's ER visit.

## 2021-04-24 ENCOUNTER — Ambulatory Visit
Admission: RE | Admit: 2021-04-24 | Discharge: 2021-04-24 | Disposition: A | Discharge status: Home / Self Care | Payer: BC Managed Care – PPO | Source: Ambulatory Visit

## 2021-04-24 ENCOUNTER — Other Ambulatory Visit: Payer: Self-pay

## 2021-04-24 VITALS — BP 111/75 | HR 71 | Temp 98.7°F | Resp 18

## 2021-04-24 DIAGNOSIS — L02411 Cutaneous abscess of right axilla: Secondary | ICD-10-CM | POA: Diagnosis not present

## 2021-04-24 HISTORY — DX: Polycystic ovarian syndrome: E28.2

## 2021-04-24 MED ORDER — DOXYCYCLINE HYCLATE 100 MG PO CAPS: 100.0000 mg | ORAL_CAPSULE | Freq: Two times a day (BID) | ORAL | 0 refills | Status: AC

## 2021-04-24 MED ORDER — FLUCONAZOLE 150 MG PO TABS: 150.0000 mg | ORAL_TABLET | Freq: Every day | ORAL | 0 refills | Status: AC

## 2021-04-24 NOTE — ED Triage Notes (Signed)
Pt presents for abscess under R armpit.  Did open last night with white discharge.  Still feels like pus remaining in abscess. Still tender.  No fevers.   Pt had h/o abscess in R axilla with I&D.

## 2021-04-24 NOTE — ED Provider Notes (Signed)
Chief Complaint  Patient presents with   Abscess     Subjective/HPI:  Sara Henry is a very pleasant 40 y.o. female presents with abscess to right axilla which started about 3 days ago.  Patient states that she has been applying Epsom salt and warm compresses to the area.  Patient reports that the area opened last night and has been having purulent discharge.  Patient reports tenderness to the area with no fever.  Patient is concerned that there may be continued purulence inside the wound but may need to be drained.  ROS: General/Constitutional: No fevers, chills, or night sweats Skin: See HPI Musculoskeletal: No weakness, myalgias, arthralgias, joint swelling, locking popping or limitations in joint range of motion Peripheral Vascular: No edema, numbness, discoloration, pain, or paresthesias Lymph: No swelling, red streaks or swollen lymph nodes  OBJECTIVE:  Vitals:   04/24/21 1327  BP: 111/75  Pulse: 71  Resp: 18  Temp: 98.7 F (37.1 C)  SpO2: 97%    General:   Alert and oriented, in no acute distress  Lesion Description: Open abscess noted to right axillary region which is currently draining purulence. Lymph:   No lymphadenopathy, red streaking, redness, pain or swelling noted Musculoskeletal:   No involvement of deep tissue, tendon or bony deformity, full range of motion, strength normal Extremities:   No edema, cyanosis Vascular: Normal perfusion, no arterial or venous injury Neurological: Full range of motion, sensation intact, reflexes normal, strength normal  PROCEDURE NOTE:  None needed.  Area is currently draining well.   Assessment:   1. Abscess of right axilla  Meds ordered this encounter  Medications   doxycycline (VIBRAMYCIN) 100 MG capsule    Sig: Take 1 capsule (100 mg total) by mouth 2 (two) times daily for 10 days.    Dispense:  20 capsule    Refill:  0    Order Specific Question:   Supervising Provider    Answer:   Merrilee Jansky [[id number]]    fluconazole (DIFLUCAN) 150 MG tablet    Sig: Take 1 tablet (150 mg total) by mouth daily for 2 days.    Dispense:  2 tablet    Refill:  0    Order Specific Question:   Supervising Provider    Answer:   Merrilee Jansky [id number]    Plan:      MDM: Patient presents with abscess to right axilla which started about 3 days ago.  Patient states that she has been applying Epsom salt and warm compresses to the area.  Patient reports that the area opened last night and has been having purulent discharge.  Patient reports tenderness to the area with no fever.  Patient is concerned that there may be continued purulence inside the wound but may need to be drained.  Chart review completed.  Given assessment findings, advised to continue at home care with warm compresses.  Area is open and will continue to drain until healed.  Rx'd doxycycline to the patient's preferred pharmacy.  Patient reports that she typically has a yeast infection with antibiotic use, Rx Diflucan to her preferred pharmacy as well as requested.  Return as needed.  Stable on discharge.  Patient verbalized understanding and agreed with plan.    Discharge Instructions      Take your first Diflucan at initial onset of yeast infection symptoms (vaginal itching, thick white clumpy vaginal discharge) then your second Diflucan 3 to 5 days later if symptoms do not improve.  Take antibiotic with  food as prescribed. Apply warm compresses to promote drainage. Keep covered until wound closes. Expect it to gradually improve over 7 - 10 days.  Watch for signs of worsening infection: increased redness, swelling, red streaking, fever, or worsening pain. If these symptoms are present, or if you have new or concerning symptoms, return to clinic immediately for a recheck.          Amalia Greenhouse, FNP-C 04/24/21   This note was partially made with the aid of speech-to-text dictation; typographical errors are not intentional.    Amalia Greenhouse, FNP 04/24/21 1352

## 2021-04-24 NOTE — Discharge Instructions (Addendum)
Take your first Diflucan at initial onset of yeast infection symptoms (vaginal itching, thick white clumpy vaginal discharge) then your second Diflucan 3 to 5 days later if symptoms do not improve.  Take antibiotic with food as prescribed. Apply warm compresses to promote drainage. Keep covered until wound closes. Expect it to gradually improve over 7 - 10 days.  Watch for signs of worsening infection: increased redness, swelling, red streaking, fever, or worsening pain. If these symptoms are present, or if you have new or concerning symptoms, return to clinic immediately for a recheck.

## 2021-07-03 ENCOUNTER — Ambulatory Visit
Admission: RE | Admit: 2021-07-03 | Disposition: A | Discharge status: Home / Self Care | Payer: BC Managed Care – PPO | Source: Ambulatory Visit | Attending: Physician Assistant | Admitting: Physician Assistant

## 2021-08-08 ENCOUNTER — Inpatient Hospital Stay
Admission: RE | Admit: 2021-08-08 | Discharge: 2021-08-08 | Disposition: A | Discharge status: Home / Self Care | Payer: BC Managed Care – PPO | Source: Ambulatory Visit | Attending: Physician Assistant | Admitting: Physician Assistant

## 2021-12-15 ENCOUNTER — Encounter: Payer: Self-pay | Admitting: Emergency Medicine

## 2021-12-15 ENCOUNTER — Other Ambulatory Visit: Payer: Self-pay

## 2021-12-15 ENCOUNTER — Emergency Department
Admission: EM | Admit: 2021-12-15 | Discharge: 2021-12-15 | Disposition: A | Discharge status: Home / Self Care | Payer: BC Managed Care – PPO | Attending: Emergency Medicine | Admitting: Emergency Medicine

## 2021-12-15 ENCOUNTER — Emergency Department: Payer: BC Managed Care – PPO

## 2021-12-15 DIAGNOSIS — Z20822 Contact with and (suspected) exposure to covid-19: Secondary | ICD-10-CM | POA: Insufficient documentation

## 2021-12-15 DIAGNOSIS — R519 Headache, unspecified: Secondary | ICD-10-CM | POA: Diagnosis not present

## 2021-12-15 DIAGNOSIS — R0981 Nasal congestion: Secondary | ICD-10-CM | POA: Diagnosis not present

## 2021-12-15 DIAGNOSIS — R1011 Right upper quadrant pain: Secondary | ICD-10-CM | POA: Insufficient documentation

## 2021-12-15 DIAGNOSIS — R112 Nausea with vomiting, unspecified: Secondary | ICD-10-CM | POA: Diagnosis not present

## 2021-12-15 DIAGNOSIS — J0110 Acute frontal sinusitis, unspecified: Secondary | ICD-10-CM | POA: Diagnosis not present

## 2021-12-15 DIAGNOSIS — R531 Weakness: Secondary | ICD-10-CM | POA: Insufficient documentation

## 2021-12-15 LAB — COMPREHENSIVE METABOLIC PANEL
ALT: 120 U/L — ABNORMAL HIGH (ref 0–44)
AST: 83 U/L — ABNORMAL HIGH (ref 15–41)
Albumin: 3.5 g/dL (ref 3.5–5.0)
Alkaline Phosphatase: 132 U/L — ABNORMAL HIGH (ref 38–126)
Anion gap: 8 (ref 5–15)
BUN: 5 mg/dL — ABNORMAL LOW (ref 6–20)
CO2: 26 mmol/L (ref 22–32)
Calcium: 8.7 mg/dL — ABNORMAL LOW (ref 8.9–10.3)
Chloride: 102 mmol/L (ref 98–111)
Creatinine, Ser: 0.69 mg/dL (ref 0.44–1.00)
GFR, Estimated: >60 mL/min (ref >60)
Glucose, Bld: 141 mg/dL — ABNORMAL HIGH (ref 70–99)
Potassium: 3.8 mmol/L (ref 3.5–5.1)
Sodium: 136 mmol/L (ref 135–145)
Total Bilirubin: 0.8 mg/dL (ref 0.3–1.2)
Total Protein: 7 g/dL (ref 6.5–8.1)

## 2021-12-15 LAB — URINALYSIS, ROUTINE W REFLEX MICROSCOPIC
Bacteria, UA: NONE SEEN
Bilirubin Urine: NEGATIVE
Glucose, UA: NEGATIVE mg/dL
Ketones, ur: 5 mg/dL — AB
Nitrite: NEGATIVE
Protein, ur: 30 mg/dL — AB
Specific Gravity, Urine: 1.02 (ref 1.005–1.030)
pH: 5 (ref 5.0–8.0)

## 2021-12-15 LAB — CBC WITH DIFFERENTIAL/PLATELET
Abs Immature Granulocytes: 0.07 10*3/uL (ref 0.00–0.07)
Basophils Absolute: 0.1 10*3/uL (ref 0.0–0.1)
Basophils Relative: 1 %
Eosinophils Absolute: 0.1 10*3/uL (ref 0.0–0.5)
Eosinophils Relative: 1 %
HCT: 32.3 % — ABNORMAL LOW (ref 36.0–46.0)
Hemoglobin: 9.5 g/dL — ABNORMAL LOW (ref 12.0–15.0)
Immature Granulocytes: 1 %
Lymphocytes Relative: 44 %
Lymphs Abs: 3 10*3/uL (ref 0.7–4.0)
MCH: 22.5 pg — ABNORMAL LOW (ref 26.0–34.0)
MCHC: 29.4 g/dL — ABNORMAL LOW (ref 30.0–36.0)
MCV: 76.4 fL — ABNORMAL LOW (ref 80.0–100.0)
Monocytes Absolute: 0.6 10*3/uL (ref 0.1–1.0)
Monocytes Relative: 9 %
Neutro Abs: 3 10*3/uL (ref 1.7–7.7)
Neutrophils Relative %: 44 %
Platelets: 298 10*3/uL (ref 150–400)
RBC: 4.23 MIL/uL (ref 3.87–5.11)
RDW: 19.1 % — ABNORMAL HIGH (ref 11.5–15.5)
Smear Review: NORMAL
WBC Morphology: ABNORMAL
WBC: 6.8 10*3/uL (ref 4.0–10.5)
nRBC: 0 % (ref 0.0–0.2)

## 2021-12-15 LAB — CK: Total CK: 48 U/L (ref 38–234)

## 2021-12-15 LAB — PREGNANCY, URINE: Preg Test, Ur: NEGATIVE

## 2021-12-15 LAB — RESP PANEL BY RT-PCR (FLU A&B, COVID) ARPGX2
Influenza A by PCR: NEGATIVE
Influenza B by PCR: NEGATIVE
SARS Coronavirus 2 by RT PCR: NEGATIVE

## 2021-12-15 MED ORDER — LIDOCAINE 5 % EX PTCH
1.0000 | MEDICATED_PATCH | CUTANEOUS | Status: DC
  Administered 2021-12-15: 1 via TRANSDERMAL
  Filled 2021-12-15: qty 1

## 2021-12-15 MED ORDER — AMOXICILLIN-POT CLAVULANATE 875-125 MG PO TABS: 1.0000 | ORAL_TABLET | Freq: Two times a day (BID) | ORAL | 0 refills | Status: AC

## 2021-12-15 MED ORDER — ACETAMINOPHEN 500 MG PO TABS
1000.0000 mg | ORAL_TABLET | Freq: Once | ORAL | Status: AC
  Administered 2021-12-15: 1000 mg via ORAL
  Filled 2021-12-15: qty 2

## 2021-12-15 MED ORDER — FLUCONAZOLE 150 MG PO TABS: 150.0000 mg | ORAL_TABLET | Freq: Once | ORAL | 0 refills | Status: AC

## 2021-12-15 NOTE — ED Notes (Signed)
First Nurse Note:  Pt to ED via [facility] from urgent care for generalized weakness. Pt is in NAD. ?

## 2021-12-15 NOTE — ED Triage Notes (Signed)
Pt to ED via [facility] with c/o generalized weakness, fever, headache and chills for the last week, she has taken Ibuprofen and pseudoephedrine. Medication helps until it wears off then her symptoms come right back, She went to urgent care and she broke out in a sweat and they took her temp and they told her that her temp was 93.9 and sent her here.  ?

## 2021-12-15 NOTE — ED Provider Notes (Signed)
----------------------------------------- ?  5:51 PM on 12/15/2021 ?----------------------------------------- ? ?Blood pressure 120/78, pulse 87, temperature 99.3 ?F (37.4 ?C), temperature source Oral, resp. rate 18, height 5\' 8"  (1.727 m), weight 122.5 kg, last menstrual period 12/13/2021, SpO2 97 %. ? ?Assuming care from Dr. Jari Pigg.  In short, Sara Henry is a 41 y.o. female with a chief complaint of Fever, Headache, Weakness, and Nasal Congestion ?Marland Kitchen  Refer to the original H&P for additional details. ? ?The current plan of care is to await CT scan and ultrasound.  Patient presented to the emergency department with ongoing congestion, sinus pressure, as well as reported fevers, headache, body aches, chills, nausea, vomiting, diarrhea for a week.  Majority of symptoms had improved with the exception of the congestion and sinus pressure.  Patient did have some tenderness in the right upper quadrant with elevated LFTs.  Current plan was to await CT scan and ultrasound.  Given the elevation differential included cholecystitis, choledocholithiasis, cholelithiasis, transaminitis secondary to viral infection.  Imaging is reassuring at this time.  I suspect that patient had a viral illness that has led to sinusitis.  Current plan is to discharge the patient with antibiotics, patient will be prescribed Augmentin.  Patient may also use allergy medicines for additional symptom control of congestion and sinus pressure.  Follow-up primary care as needed.  Return precautions discussed with patient. ? ? ?ED diagnosis: ? ?Right upper quadrant pain ?Acute frontal sinusitis ? ?  ?Darletta Moll, PA-C ?12/15/21 1810 ? ?  ?Vanessa Moravia, MD ?12/16/21 1527 ? ?

## 2021-12-15 NOTE — ED Provider Triage Note (Signed)
Emergency Medicine Provider Triage Evaluation Note ? ?Sara Henry , a 41 y.o. female  was evaluated in triage.  Pt complains of fever, headache, bodyaches, and chills for the past week. She had gone to Urgent Care and her temperature was 93.9, so they called EMS to bring her to the ER. ? ?Review of Systems  ?Positive: Headache, fever, chills ?Negative: Nausea, vomiting, diarrhea ? ?Physical Exam  ?There were no vitals taken for this visit. ?Gen:   Awake, no distress   ?Resp:  Normal effort  ?MSK:   Moves extremities without difficulty  ?Other:   ? ?Medical Decision Making  ?Medically screening exam initiated at 12:46 PM.  Appropriate orders placed.  Sara Henry was informed that the remainder of the evaluation will be completed by another provider, this initial triage assessment does not replace that evaluation, and the importance of remaining in the ED until their evaluation is complete. ?  ?Chinita Pester, FNP ?12/15/21 1248 ? ?

## 2021-12-15 NOTE — Discharge Instructions (Addendum)
Follow-up with your primary care doctor for recheck of your liver function test as well as a work-up for your low hemoglobin.  Return to the ER if you develop worsening symptoms or any other concerns.  Given you have had your symptoms for so long we are starting on some antibiotics for possible sinusitis. ? ? ?

## 2021-12-15 NOTE — ED Provider Notes (Signed)
? ?[facility] ?Provider Note ? ? ? Event Date/Time  ? First MD Initiated Contact with Patient 12/15/21 1249   ?  (approximate) ? ? ?History  ? ?Fever, Headache, Weakness, and Nasal Congestion ? ? ?HPI ? ?Sara Henry is a 41 y.o. female who comes in with fevers, headache, body aches, chills, nausea, vomiting, diarrhea R upper back pain..  Patient does report that she is diabetic and she is supposed to be taking metformin.  Her sugars were in the upper 100s at urgent care.  They were concerned about patient having a low temperature there but here her temperature was 99.3.  Patient denies any sore throat.  She denies any abdominal pain.  Patient reports that her symptoms have been going on for about a week and that they cause pressure sensation in her forehead.  No neck stiffness. She reports taking over-the-counter flu medications but still having symptoms.  She has not taken any medications yet today. ? ?Physical Exam  ? ?Triage Vital Signs: ?ED Triage Vitals  ?Enc Vitals Group  ?   BP 12/15/21 1247 116/78  ?   Pulse Rate 12/15/21 1247 88  ?   Resp 12/15/21 1247 18  ?   Temp 12/15/21 1247 99.3 ?F (37.4 ?C)  ?   Temp Source 12/15/21 1247 Oral  ?   SpO2 12/15/21 1247 96 %  ?   Weight 12/15/21 1248 270 lb (122.5 kg)  ?   Height 12/15/21 1248 5\' 8"  (1.727 m)  ?   Head Circumference --   ?   Peak Flow --   ?   Pain Score 12/15/21 1247 10  ?   Pain Loc --   ?   Pain Edu? --   ?   Excl. in [place]? --   ? ? ?Most recent vital signs: ?Vitals:  ? 12/15/21 1247  ?BP: 116/78  ?Pulse: 88  ?Resp: 18  ?Temp: 99.3 ?F (37.4 ?C)  ?SpO2: 96%  ? ? ? ?General: Awake, no distress.  ?CV:  Good peripheral perfusion.  ?Resp:  Normal effort.  ?Abd:  No distention. Tender right flank and little RUQ.  ?Other:  Full ROM of neck with no rigidity. Sinus pressure noted on exam.  ? ? ?ED Results / Procedures / Treatments  ? ?Labs ?(all labs ordered are listed, but only abnormal results are displayed) ?Labs Reviewed  ?CBC WITH  DIFFERENTIAL/PLATELET - Abnormal; Notable for the following components:  ?    Result Value  ? Hemoglobin 9.5 (*)   ? HCT 32.3 (*)   ? MCV 76.4 (*)   ? MCH 22.5 (*)   ? MCHC 29.4 (*)   ? RDW 19.1 (*)   ? All other components within normal limits  ?COMPREHENSIVE METABOLIC PANEL - Abnormal; Notable for the following components:  ? Glucose, Bld 141 (*)   ? BUN 5 (*)   ? Calcium 8.7 (*)   ? AST 83 (*)   ? ALT 120 (*)   ? Alkaline Phosphatase 132 (*)   ? All other components within normal limits  ?URINALYSIS, ROUTINE W REFLEX MICROSCOPIC - Abnormal; Notable for the following components:  ? Color, Urine AMBER (*)   ? APPearance HAZY (*)   ? Hgb urine dipstick SMALL (*)   ? Ketones, ur 5 (*)   ? Protein, ur 30 (*)   ? Leukocytes,Ua TRACE (*)   ? All other components within normal limits  ?RESP PANEL BY RT-PCR (FLU A&B, COVID) ARPGX2  ?  PREGNANCY, URINE  ?CK  ? ? ? ?RADIOLOGY ?CT/US pending  ? ? ?PROCEDURES: ? ?Critical Care performed: No ? ?Procedures ? ? ?MEDICATIONS ORDERED IN ED: ?Medications  ?lidocaine (LIDODERM) 5 % 1 patch (1 patch Transdermal Patch Applied 12/15/21 1332)  ?acetaminophen (TYLENOL) tablet 1,000 mg (1,000 mg Oral Given 12/15/21 1331)  ? ? ? ?IMPRESSION / MDM / ASSESSMENT AND PLAN / ED COURSE  ?I reviewed the triage vital signs and the nursing notes. ?             ?               ? ?Differential diagnosis includes, but is not limited to, multiple symptoms for over a week. Sounds viral in nature will start off with labs, covid, UA.  Patient given a lidocaine patch and some Tylenol to help with discomfort.  Patient is very well-appearing I doubt sepsis or bacteremia or meningitis ? ?Pregnancy test was negative.  CBC was reassuring although slightly low hemoglobin which patient can follow-up with outpatient she expressed understanding.  CMP shows an elevated LFTs.  We will add on CK level.  She reports only occasional Tylenol use.  Denies ever being more than 4 g in a day.  Patient still reporting a lot of  right flank pain and given the elevated LFTs this could be related to her gallbladder versus kidney stone.  We will get ultrasound and CT to further evaluate.  Patient handed off to oncoming team pending these results.  If negative will discharge patient with prescription of antibiotics for most likely sinusitis given her prolonged symptoms for over a week. ? ? ? ?FINAL CLINICAL IMPRESSION(S) / ED DIAGNOSES  ? ?Final diagnoses:  ?RUQ pain  ?Acute non-recurrent frontal sinusitis  ? ? ? ?Rx / DC Orders  ? ?ED Discharge Orders   ? ?      Ordered  ?  amoxicillin-clavulanate (AUGMENTIN) 875-125 MG tablet  2 times daily       ? 12/15/21 1509  ? ?  ?  ? ?  ? ? ? ?Note:  This document was prepared using Dragon voice recognition software and may include unintentional dictation errors. ?  ?Vanessa Kingman, MD ?12/15/21 1517 ? ?

## 2022-05-04 IMAGING — US US ABDOMEN LIMITED
1 series · 14 of 25 positions shown · non-contrast
Comparison: CT of earlier today

CLINICAL DATA: Abdominal pain.

EXAM:
ULTRASOUND ABDOMEN LIMITED RIGHT UPPER QUADRANT

[Series 1: us abdomen limited ruq (liver/gb) · 14 of 48 slices shown]
[im 1/48]
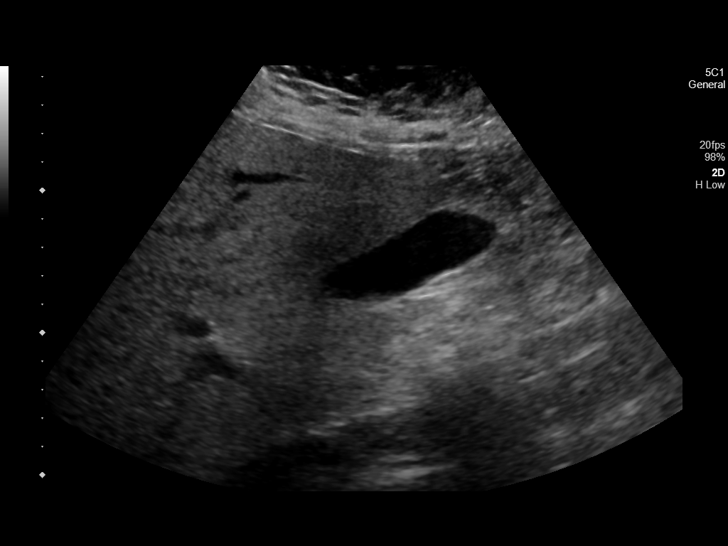
[im 4/48]
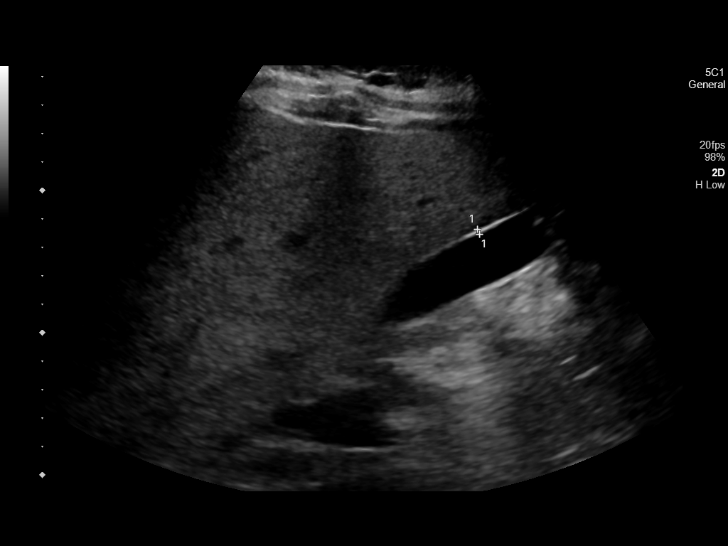
[im 8/48]
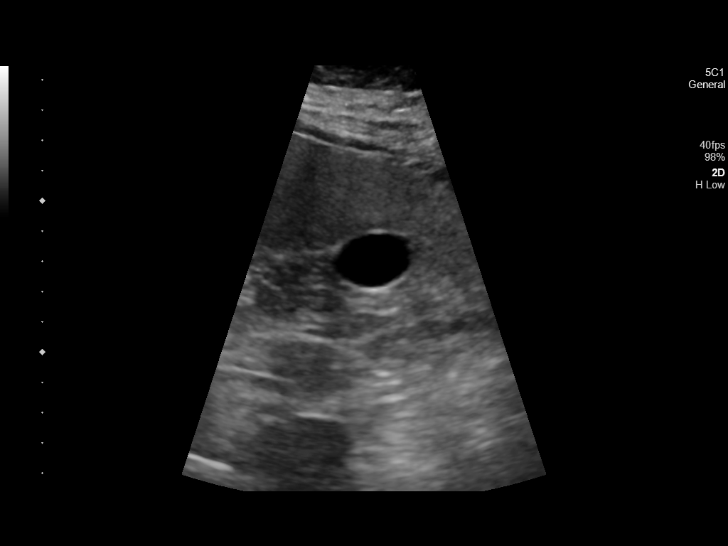
[im 12/48]
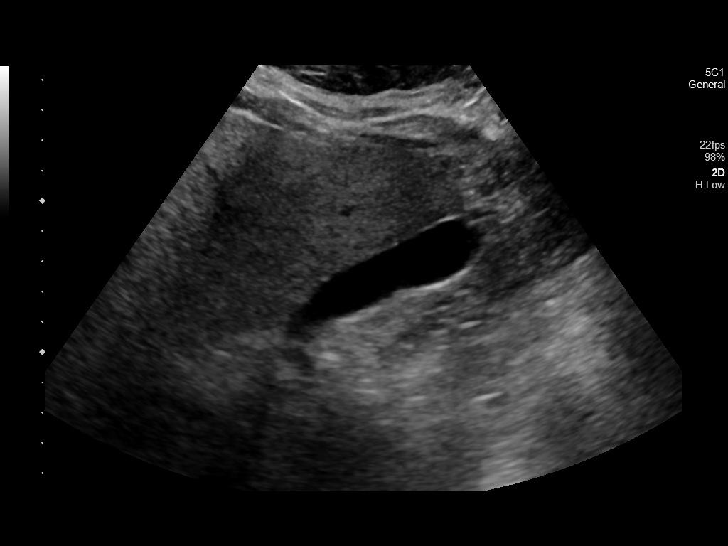
[im 16/48]
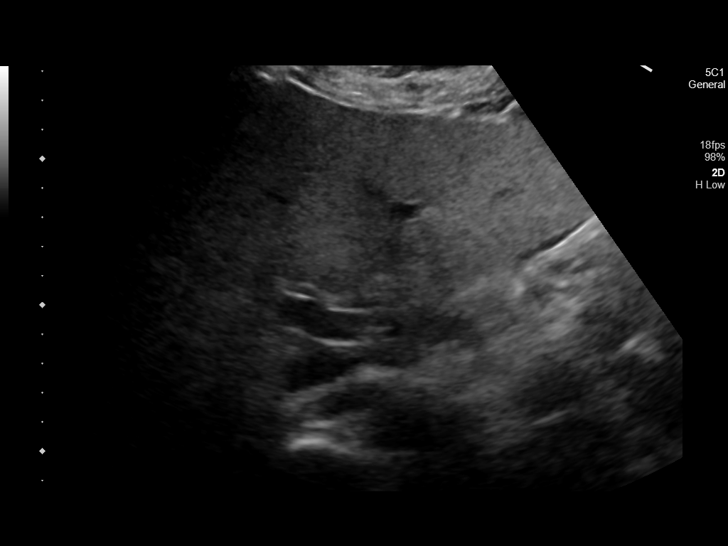
[im 18/48]
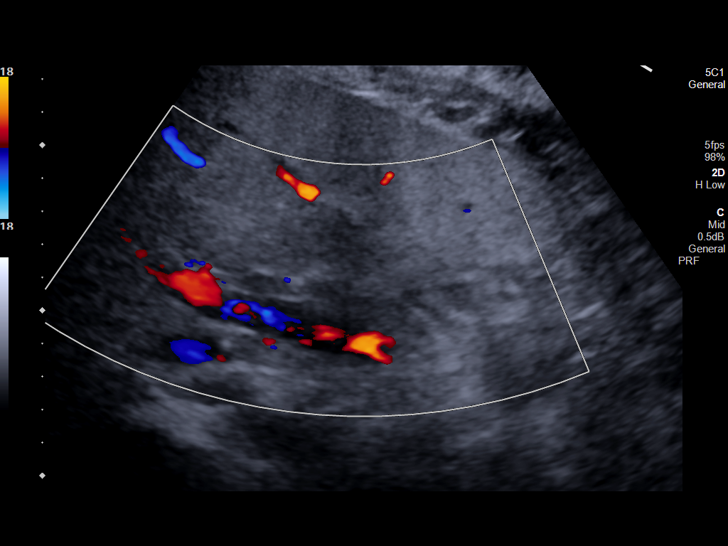
[im 22/48]
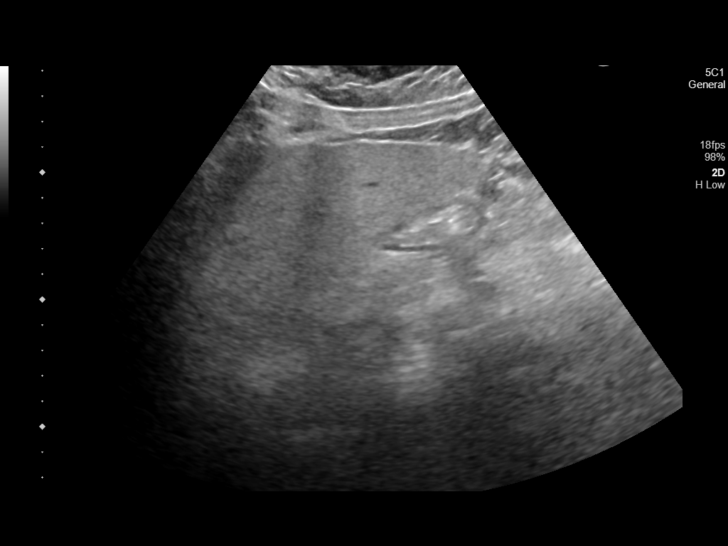
[im 26/48]
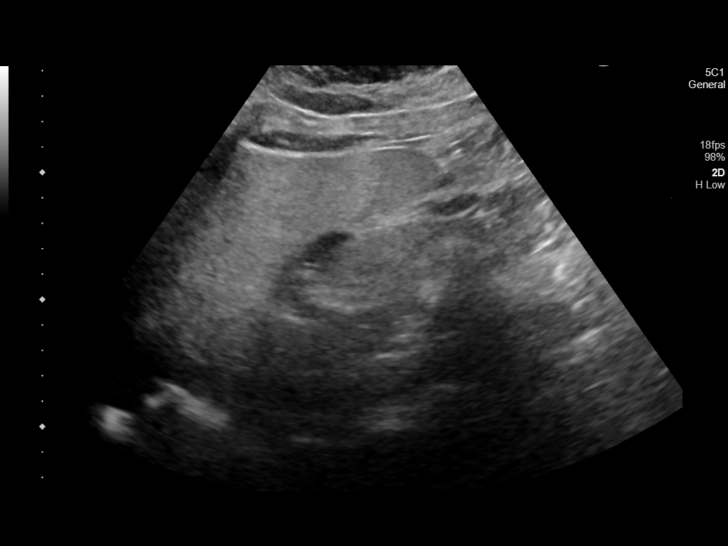
[im 30/48]
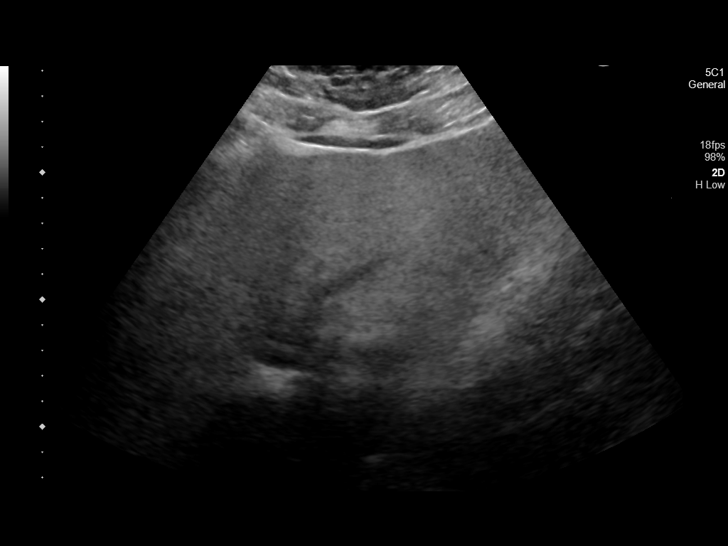
[im 32/48]
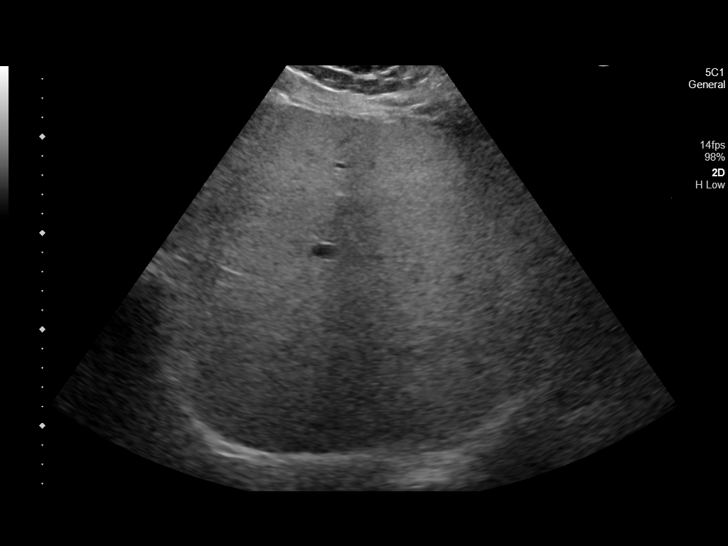
[im 36/48]
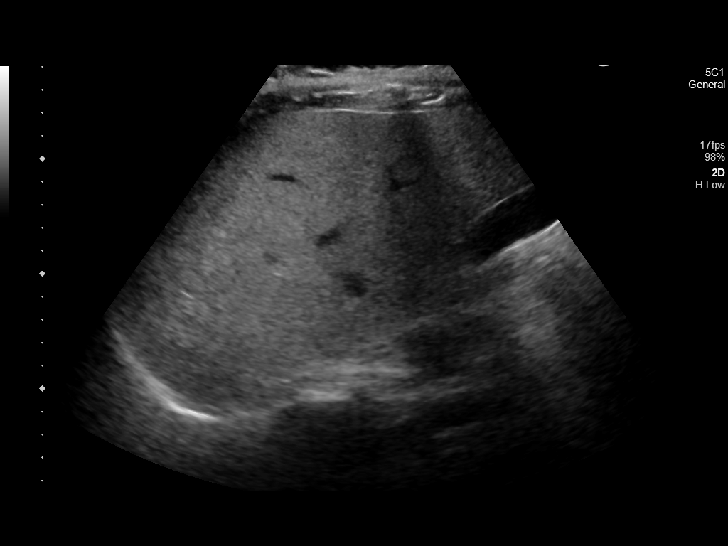
[im 40/48]
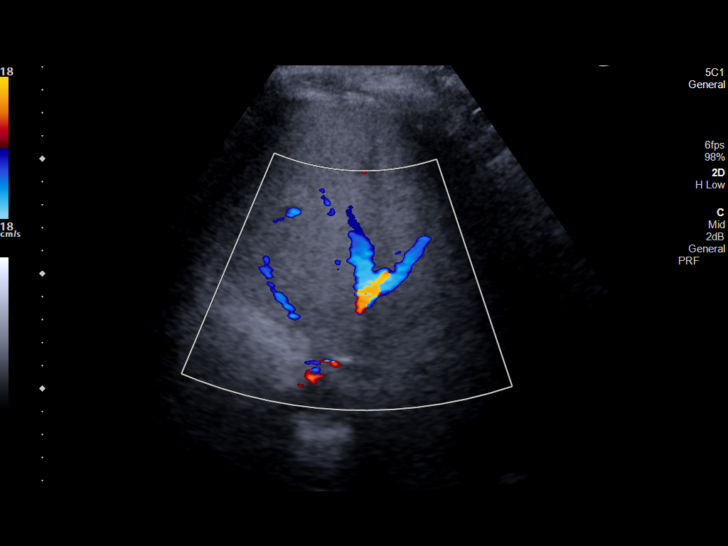
[im 44/48]
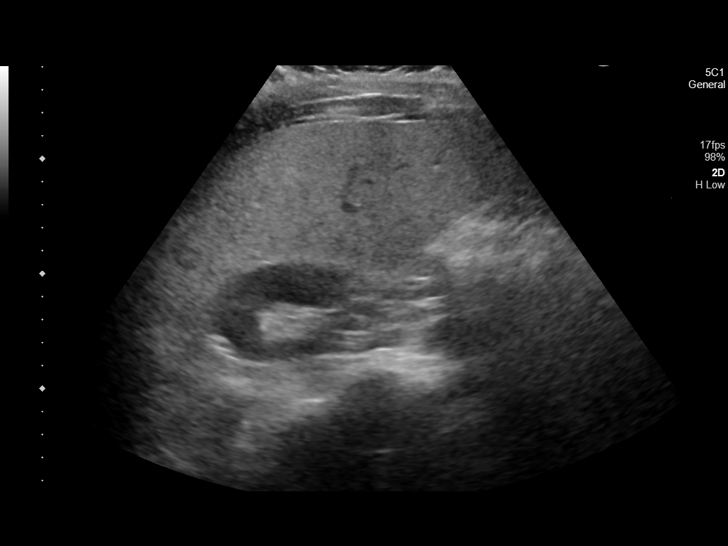
[im 48/48]
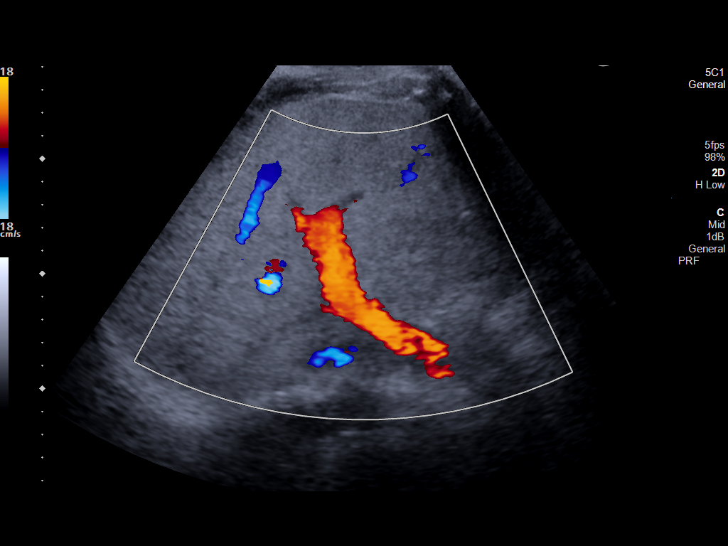

[14 of 25 positions shown; findings below may reference images not displayed]

FINDINGS: Gallbladder:

No gallstones or wall thickening visualized. No sonographic Murphy
sign noted by sonographer.

Common bile duct:

Diameter: Upper normal, 6 mm.  Not dilated on today's CT.

Liver:

Moderately increased hepatic echogenicity. Portal vein is patent on
color Doppler imaging with normal direction of blood flow towards
the liver.

Other: None.
IMPRESSION: No gallstones or biliary duct dilatation.

Increased hepatic echogenicity, consistent with steatosis.

## 2022-05-04 IMAGING — CT CT RENAL STONE PROTOCOL
2 of 4 series · 16 of 46 positions shown, 18 images · non-contrast
Comparison: None.

CLINICAL DATA: Flank pain, kidney stone suspected.



[Series 2: ap without · axial · non-contrast · 0.91mm/px · z∈[-986,-506]mm · 13 of 110 slices shown, 15 images]
[im 7/110  soft-tissue]
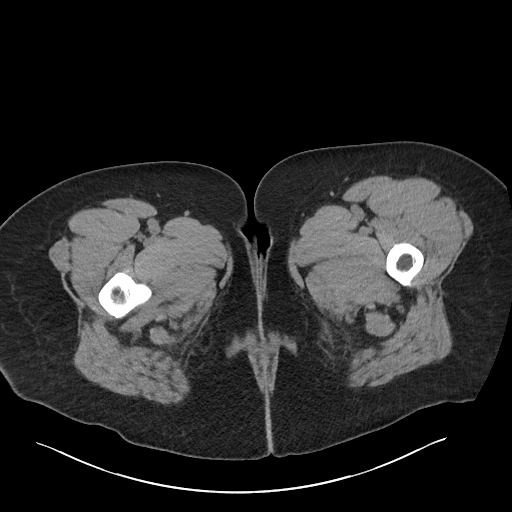
[im 7/110  bone]
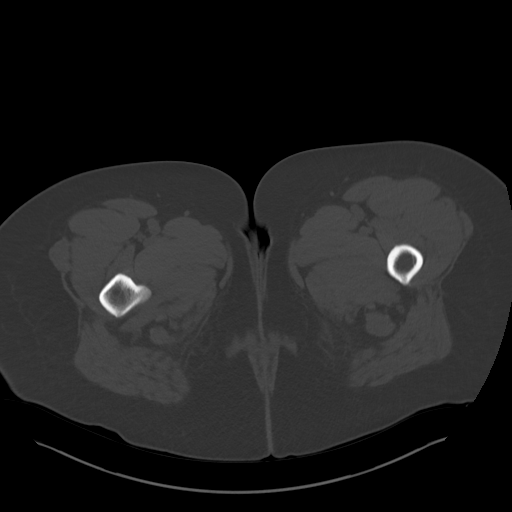
[im 13/110  soft-tissue]
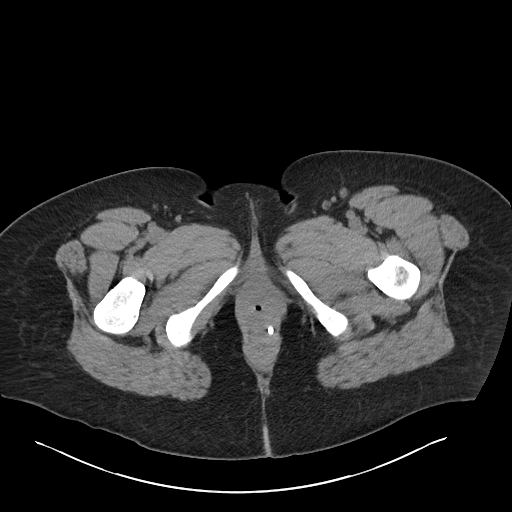
[im 25/110  soft-tissue]
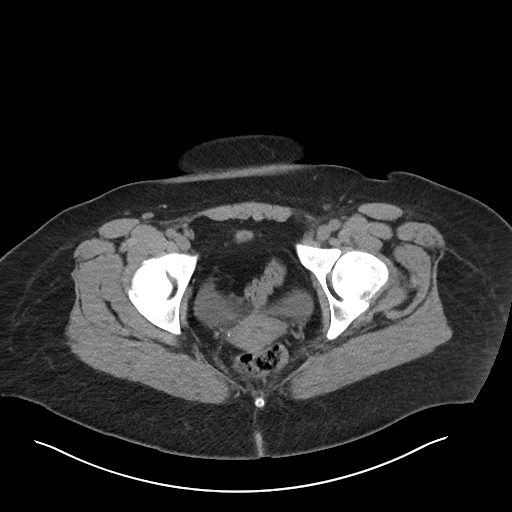
[im 31/110  soft-tissue]
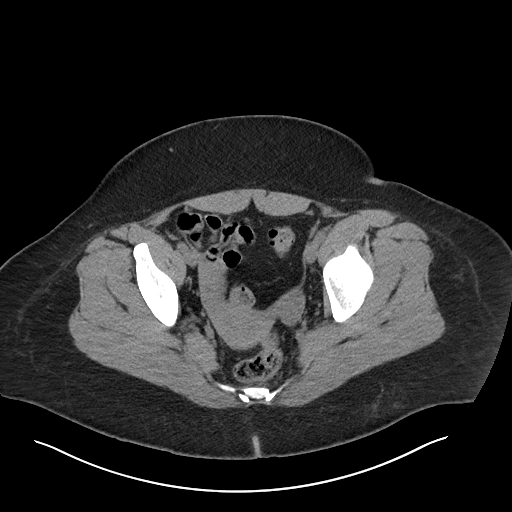
[im 37/110  soft-tissue]
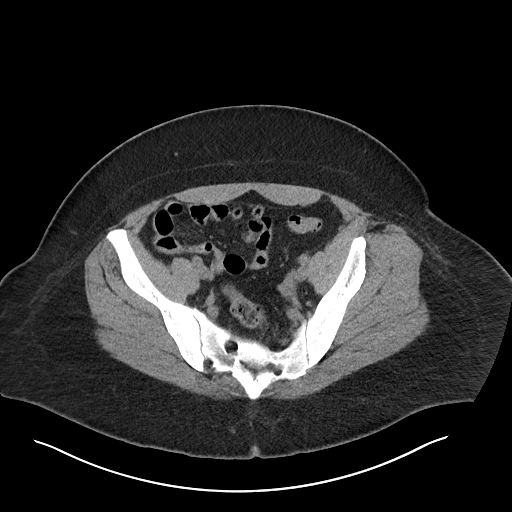
[im 49/110  soft-tissue]
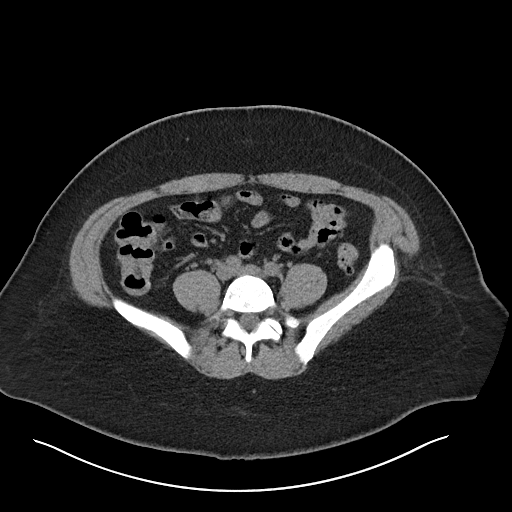
[im 55/110  soft-tissue]
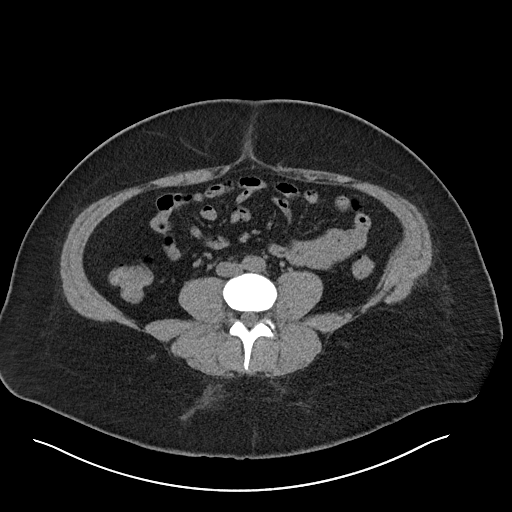
[im 61/110  soft-tissue]
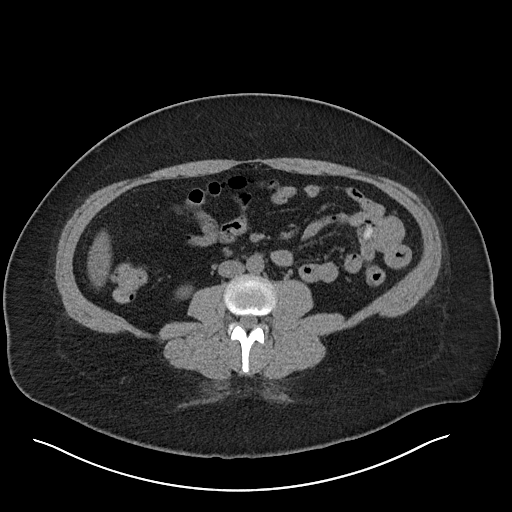
[im 73/110  soft-tissue]
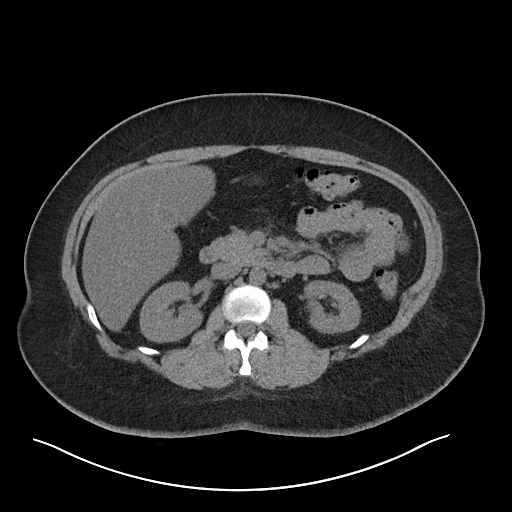
[im 73/110  bone]
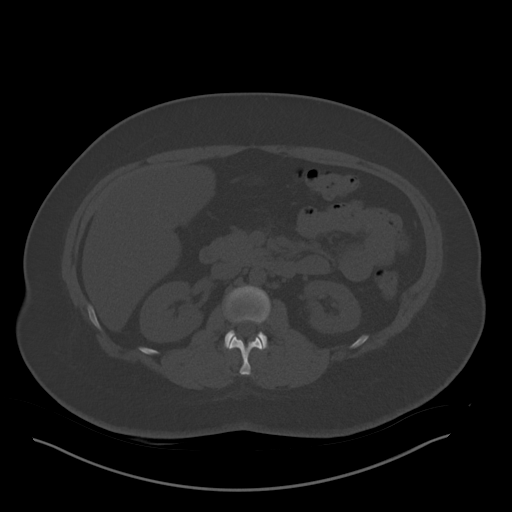
[im 79/110  soft-tissue]
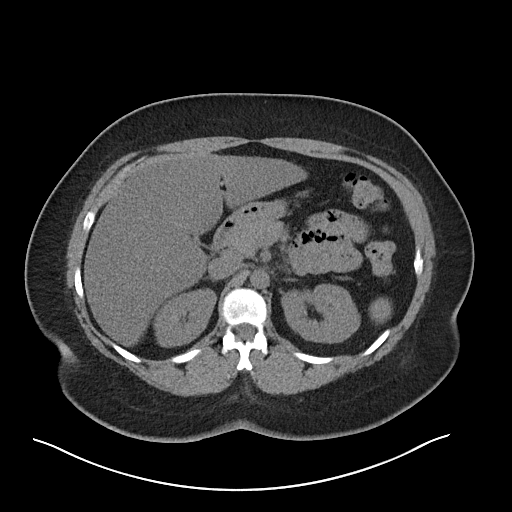
[im 85/110  soft-tissue]
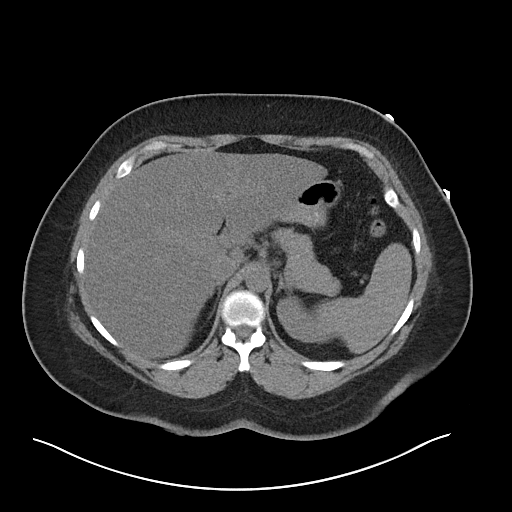
[im 97/110  soft-tissue]
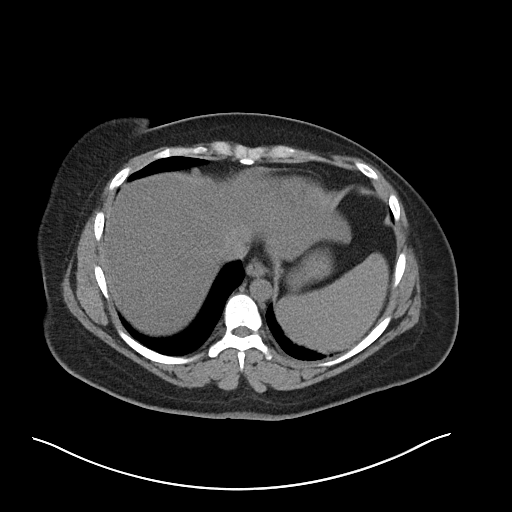
[im 103/110  soft-tissue]
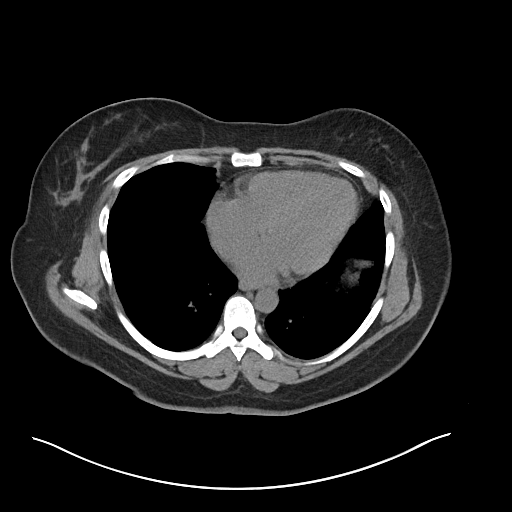

[Series 5: cor · coronal · 0.96mm/px · 3 of 104 slices shown]
[im 35/104  soft-tissue]
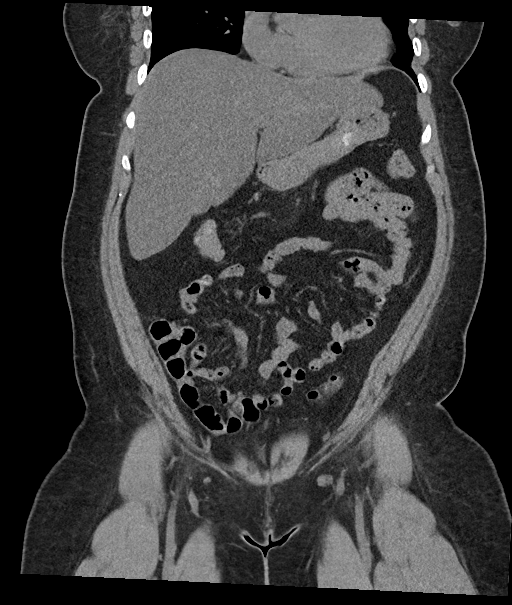
[im 46/104  soft-tissue]
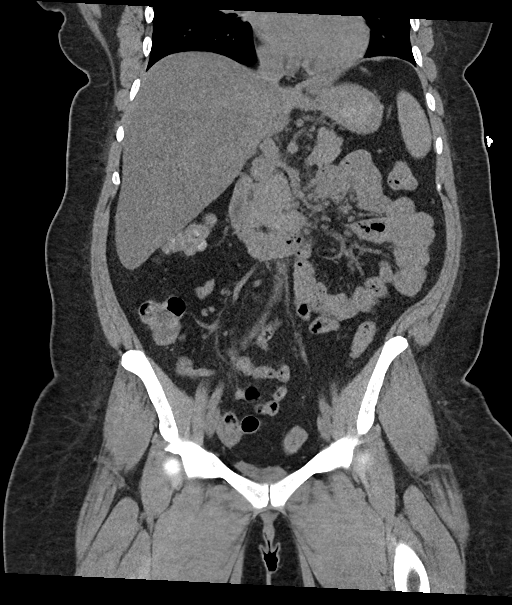
[im 58/104  soft-tissue]
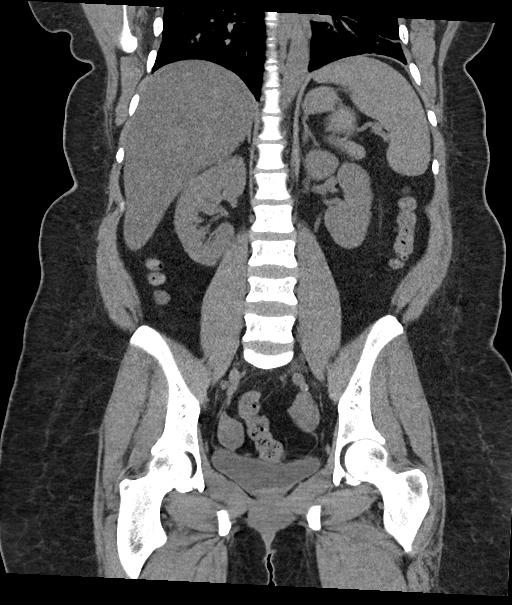

[16 of 46 positions shown; findings below may reference images not displayed]

FINDINGS: Lower chest: Left lower lobe subsegmental linear atelectasis.

Hepatobiliary: Diffuse low attenuation of hepatic parenchyma
concerning for hepatic steatosis. Liver is enlarged measuring up to
21 cm in craniocaudal dimension. No gallstones, gallbladder wall
thickening, or biliary dilatation.

Pancreas: Unremarkable. No pancreatic ductal dilatation or
surrounding inflammatory changes.

Spleen: Normal in size without focal abnormality.

Adrenals/Urinary Tract: Adrenal glands are unremarkable. Kidneys are
normal, without renal calculi, focal lesion, or hydronephrosis.
Bladder is unremarkable.

Stomach/Bowel: Stomach is within normal limits. Appendix appears
normal. No evidence of bowel wall thickening, distention, or
inflammatory changes. Scattered colonic diverticula without evidence
of acute diverticulitis.

Vascular/Lymphatic: No significant vascular findings are present. No
enlarged abdominal or pelvic lymph nodes.

Reproductive: Uterus and bilateral adnexa are unremarkable.

Other: No abdominal wall hernia or abnormality. No abdominopelvic
ascites.

Musculoskeletal: No acute or significant osseous findings.
IMPRESSION: 1.  Mild hepatomegaly with hepatic steatosis.

2.  No evidence of nephrolithiasis or hydronephrosis.

3. Scattered colonic diverticula without evidence of acute
diverticulitis. Normal appendix.

## 2022-11-02 ENCOUNTER — Other Ambulatory Visit: Payer: Self-pay

## 2022-11-02 ENCOUNTER — Encounter: Payer: Self-pay | Admitting: Emergency Medicine

## 2022-11-02 ENCOUNTER — Emergency Department: Payer: BC Managed Care – PPO

## 2022-11-02 ENCOUNTER — Emergency Department
Admission: EM | Admit: 2022-11-02 | Discharge: 2022-11-02 | Disposition: A | Discharge status: Home / Self Care | Payer: BC Managed Care – PPO | Attending: Emergency Medicine | Admitting: Emergency Medicine

## 2022-11-02 DIAGNOSIS — N920 Excessive and frequent menstruation with regular cycle: Secondary | ICD-10-CM | POA: Diagnosis present

## 2022-11-02 LAB — URINALYSIS, ROUTINE W REFLEX MICROSCOPIC
Bilirubin Urine: NEGATIVE
Glucose, UA: NEGATIVE mg/dL
Ketones, ur: 5 mg/dL — AB
Leukocytes,Ua: NEGATIVE
Nitrite: NEGATIVE
Protein, ur: 30 mg/dL — AB
RBC / HPF: >50 RBC/hpf — ABNORMAL HIGH (ref 0–5)
Specific Gravity, Urine: 1.026 (ref 1.005–1.030)
pH: 5 (ref 5.0–8.0)

## 2022-11-02 LAB — CBC
HCT: 32.4 % — ABNORMAL LOW (ref 36.0–46.0)
Hemoglobin: 9.8 g/dL — ABNORMAL LOW (ref 12.0–15.0)
MCH: 24.3 pg — ABNORMAL LOW (ref 26.0–34.0)
MCHC: 30.2 g/dL (ref 30.0–36.0)
MCV: 80.4 fL (ref 80.0–100.0)
Platelets: 388 10*3/uL (ref 150–400)
RBC: 4.03 MIL/uL (ref 3.87–5.11)
RDW: 18.5 % — ABNORMAL HIGH (ref 11.5–15.5)
WBC: 7.4 10*3/uL (ref 4.0–10.5)
nRBC: 0 % (ref 0.0–0.2)

## 2022-11-02 LAB — BASIC METABOLIC PANEL
Anion gap: 10 (ref 5–15)
BUN: 7 mg/dL (ref 6–20)
CO2: 23 mmol/L (ref 22–32)
Calcium: 9 mg/dL (ref 8.9–10.3)
Chloride: 105 mmol/L (ref 98–111)
Creatinine, Ser: 0.63 mg/dL (ref 0.44–1.00)
GFR, Estimated: >60 mL/min (ref >60)
Glucose, Bld: 97 mg/dL (ref 70–99)
Potassium: 3.6 mmol/L (ref 3.5–5.1)
Sodium: 138 mmol/L (ref 135–145)

## 2022-11-02 LAB — HCG, QUANTITATIVE, PREGNANCY: hCG, Beta Chain, Quant, S: <1 m[IU]/mL (ref <5)

## 2022-11-02 LAB — POC URINE PREG, ED: Preg Test, Ur: NEGATIVE

## 2022-11-02 MED ORDER — KETOROLAC TROMETHAMINE 30 MG/ML IJ SOLN
30.0000 mg | Freq: Once | INTRAMUSCULAR | Status: AC
  Administered 2022-11-02: 30 mg via INTRAMUSCULAR
  Filled 2022-11-02: qty 1

## 2022-11-02 NOTE — ED Triage Notes (Signed)
Pt presents to the ED via POV c/o pain on the right side of abdomen. Pt has been on her cycle and states that it has been "really heavy". Pt was at work today and had a syncopal episode that lasted a couple of minutes and had dizziness before episode. Pt has been going through 4 pads a day.

## 2022-11-02 NOTE — ED Provider Triage Note (Signed)
Emergency Medicine Provider Triage Evaluation Note  Sara Henry , a 42 y.o. female  was evaluated in triage.  Pt complains of heavier period than normal and passed out for a couple of seconds. Felt forward onto her desk. No history of syncope. No dizziness now when sitting, but feels dizzy when she stands. Going through 4 pads daily.  Reports RLQ pain.  Review of Systems  Positive: Vaginal bleeding Negative: Fever, chills  Physical Exam  BP (!) 141/93 (BP Location: Left Arm)   Pulse 78   Temp 98.6 F (37 C) (Oral)   Resp 18   Ht 5\' 8"  (1.727 m)   Wt 116.6 kg   SpO2 100%   BMI 39.08 kg/m  Gen:   Awake, no distress   Resp:  Normal effort  MSK:   Moves extremities without difficulty  Other:    Medical Decision Making  Medically screening exam initiated at 2:58 PM.  Appropriate orders placed.  Christean Leaf was informed that the remainder of the evaluation will be completed by another provider, this initial triage assessment does not replace that evaluation, and the importance of remaining in the ED until their evaluation is complete.     Marquette Old, PA-C 11/02/22 1500

## 2022-11-02 NOTE — ED Provider Notes (Signed)
[facility] Provider Note    Event Date/Time   First MD Initiated Contact with Patient 11/02/22 1720     (approximate)   History   Loss of Consciousness   HPI  Sara Henry is a 42 y.o. female presents with complaints of right-sided pelvic discomfort, heavy menstrual bleeding and period of lightheadedness today.  Patient reports over the weekend she has had heavy menstrual bleeding which is atypical for her, she also reports she has had intermittent cramping pain in her right pelvis which is unusual as well.  Today she felt lightheaded after an episode of pain.  Is feeling improved currently.  No fevers or chills.     Physical Exam   Triage Vital Signs: ED Triage Vitals [11/02/22 1456]  Enc Vitals Group     BP (!) 141/93     Pulse Rate 78     Resp 18     Temp 98.6 F (37 C)     Temp Source Oral     SpO2 100 %     Weight 116.6 kg (257 lb)     Height 1.727 m (5\' 8" )     Head Circumference      Peak Flow      Pain Score 8     Pain Loc      Pain Edu?      Excl. in [place]?     Most recent vital signs: Vitals:   11/02/22 1800 11/02/22 1849  BP: (!) 137/92 123/76  Pulse: 70 79  Resp: 19 15  Temp:    SpO2: 99% 100%     General: Awake, no distress.  CV:  Good peripheral perfusion.  Resp:  Normal effort.  Abd:  No distention.  Soft, nontender Other:     ED Results / Procedures / Treatments   Labs (all labs ordered are listed, but only abnormal results are displayed) Labs Reviewed  CBC - Abnormal; Notable for the following components:      Result Value   Hemoglobin 9.8 (*)    HCT 32.4 (*)    MCH 24.3 (*)    RDW 18.5 (*)    All other components within normal limits  URINALYSIS, ROUTINE W REFLEX MICROSCOPIC - Abnormal; Notable for the following components:   Color, Urine YELLOW (*)    APPearance HAZY (*)    Hgb urine dipstick LARGE (*)    Ketones, ur 5 (*)    Protein, ur 30 (*)    RBC / HPF >50 (*)    Bacteria, UA RARE (*)     All other components within normal limits  BASIC METABOLIC PANEL  HCG, QUANTITATIVE, PREGNANCY  POC URINE PREG, ED     EKG     RADIOLOGY Ultrasound viewed interpret by me, no acute abnormalities    PROCEDURES:  Critical Care performed:   Procedures   MEDICATIONS ORDERED IN ED: Medications  ketorolac (TORADOL) 30 MG/ML injection 30 mg (30 mg Intramuscular Given 11/02/22 1752)     IMPRESSION / MDM / ASSESSMENT AND PLAN / ED COURSE  I reviewed the triage vital signs and the nursing notes. Patient's presentation is most consistent with acute presentation with potential threat to life or bodily function.   Patient presents with symptoms as above.  Differential includes menorrhagia/metrorrhagia, less likely ovarian torsion, ovarian cyst, anemia  Lab work reviewed and is quite reassuring, hemoglobin is normal.  Patient well-appearing here with reassuring vitals.  Pending ultrasound results.  Ultrasound is unremarkable, patient is  feeling well, no indication for admission at this time, appropriate for discharge with outpatient follow-up     FINAL CLINICAL IMPRESSION(S) / ED DIAGNOSES   Final diagnoses:  Menorrhagia with regular cycle     Rx / DC Orders   ED Discharge Orders     None        Note:  This document was prepared using Dragon voice recognition software and may include unintentional dictation errors.   Lavonia Drafts, MD 11/02/22 2126
